# Patient Record
Sex: Female | Born: 1965 | Race: White | Hispanic: No | Smoking: Current every day smoker
Health system: Southern US, Community
[De-identification: ages and names within clinical notes are randomized; demographics above are authoritative.]

## PROBLEM LIST (undated history)

## (undated) DIAGNOSIS — E119 Type 2 diabetes mellitus without complications: Secondary | ICD-10-CM

## (undated) DIAGNOSIS — D649 Anemia, unspecified: Secondary | ICD-10-CM

## (undated) DIAGNOSIS — M1990 Unspecified osteoarthritis, unspecified site: Secondary | ICD-10-CM

## (undated) DIAGNOSIS — K219 Gastro-esophageal reflux disease without esophagitis: Secondary | ICD-10-CM

## (undated) DIAGNOSIS — I639 Cerebral infarction, unspecified: Secondary | ICD-10-CM

## (undated) DIAGNOSIS — L237 Allergic contact dermatitis due to plants, except food: Secondary | ICD-10-CM

## (undated) DIAGNOSIS — K760 Fatty (change of) liver, not elsewhere classified: Secondary | ICD-10-CM

## (undated) DIAGNOSIS — B977 Papillomavirus as the cause of diseases classified elsewhere: Secondary | ICD-10-CM

## (undated) DIAGNOSIS — I739 Peripheral vascular disease, unspecified: Secondary | ICD-10-CM

## (undated) DIAGNOSIS — Z87442 Personal history of urinary calculi: Secondary | ICD-10-CM

## (undated) DIAGNOSIS — Z8719 Personal history of other diseases of the digestive system: Secondary | ICD-10-CM

## (undated) DIAGNOSIS — I2510 Atherosclerotic heart disease of native coronary artery without angina pectoris: Secondary | ICD-10-CM

## (undated) DIAGNOSIS — T4145XA Adverse effect of unspecified anesthetic, initial encounter: Secondary | ICD-10-CM

## (undated) DIAGNOSIS — J189 Pneumonia, unspecified organism: Secondary | ICD-10-CM

## (undated) DIAGNOSIS — J449 Chronic obstructive pulmonary disease, unspecified: Secondary | ICD-10-CM

## (undated) DIAGNOSIS — I6529 Occlusion and stenosis of unspecified carotid artery: Secondary | ICD-10-CM

## (undated) HISTORY — PX: CAROTID ENDARTERECTOMY: SUR193

## (undated) HISTORY — DX: Occlusion and stenosis of unspecified carotid artery: I65.29

## (undated) HISTORY — PX: CYSTOCELE REPAIR: SHX163

## (undated) HISTORY — PX: ABDOMINAL HYSTERECTOMY: SHX81

## (undated) HISTORY — PX: DILATION AND CURETTAGE OF UTERUS: SHX78

## (undated) HISTORY — PX: AORTA SURGERY: SHX548

## (undated) HISTORY — PX: RECTOCELE REPAIR: SHX761

---

## 2011-05-15 DIAGNOSIS — T8859XA Other complications of anesthesia, initial encounter: Secondary | ICD-10-CM

## 2011-05-15 HISTORY — DX: Other complications of anesthesia, initial encounter: T88.59XA

## 2012-05-14 DIAGNOSIS — I639 Cerebral infarction, unspecified: Secondary | ICD-10-CM

## 2012-05-14 HISTORY — DX: Cerebral infarction, unspecified: I63.9

## 2015-07-21 DIAGNOSIS — N182 Chronic kidney disease, stage 2 (mild): Secondary | ICD-10-CM | POA: Insufficient documentation

## 2016-02-22 DIAGNOSIS — M79644 Pain in right finger(s): Secondary | ICD-10-CM | POA: Insufficient documentation

## 2016-02-22 DIAGNOSIS — E118 Type 2 diabetes mellitus with unspecified complications: Secondary | ICD-10-CM | POA: Insufficient documentation

## 2016-02-22 DIAGNOSIS — I6523 Occlusion and stenosis of bilateral carotid arteries: Secondary | ICD-10-CM | POA: Insufficient documentation

## 2016-12-27 DIAGNOSIS — Z720 Tobacco use: Secondary | ICD-10-CM | POA: Insufficient documentation

## 2016-12-27 DIAGNOSIS — Z8673 Personal history of transient ischemic attack (TIA), and cerebral infarction without residual deficits: Secondary | ICD-10-CM | POA: Insufficient documentation

## 2016-12-31 DIAGNOSIS — I2510 Atherosclerotic heart disease of native coronary artery without angina pectoris: Secondary | ICD-10-CM | POA: Insufficient documentation

## 2016-12-31 DIAGNOSIS — I10 Essential (primary) hypertension: Secondary | ICD-10-CM | POA: Insufficient documentation

## 2016-12-31 DIAGNOSIS — E782 Mixed hyperlipidemia: Secondary | ICD-10-CM | POA: Insufficient documentation

## 2017-05-31 ENCOUNTER — Other Ambulatory Visit: Payer: Self-pay

## 2017-05-31 DIAGNOSIS — I6522 Occlusion and stenosis of left carotid artery: Secondary | ICD-10-CM

## 2017-06-05 ENCOUNTER — Ambulatory Visit: Payer: Medicare PPO | Admitting: Vascular Surgery

## 2017-06-05 ENCOUNTER — Ambulatory Visit (HOSPITAL_COMMUNITY)
Admission: RE | Admit: 2017-06-05 | Discharge: 2017-06-05 | Disposition: A | Discharge status: Home / Self Care | Payer: Medicare PPO | Source: Ambulatory Visit | Attending: Vascular Surgery | Admitting: Vascular Surgery

## 2017-06-05 ENCOUNTER — Encounter: Payer: Self-pay | Admitting: Vascular Surgery

## 2017-06-05 VITALS — BP 127/78 | HR 86 | Temp 97.3°F | Resp 16 | Ht 61.0 in | Wt 148.0 lb

## 2017-06-05 DIAGNOSIS — I6522 Occlusion and stenosis of left carotid artery: Secondary | ICD-10-CM

## 2017-06-05 DIAGNOSIS — I6523 Occlusion and stenosis of bilateral carotid arteries: Secondary | ICD-10-CM | POA: Diagnosis not present

## 2017-06-05 NOTE — Progress Notes (Signed)
Patient name: Isabella [name] MRN: [id number] DOB: October 15, 1965 Sex: female   REASON FOR CONSULT:    Greater than 70% left carotid stenosis.  The consult is requested by Dr. Bonita Quin.  HPI:   Isabella [name] is a pleasant 52 y.o. female, with a very complicated surgical history.  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  On a duplex scan she was found to have a greater than 70% stenosis on the left side and is sent for vascular consultation.  She is right-handed.  She denies any sudden onset of weakness or paresthesias in the right upper extremity or lower extremity.  She denies expressive or receptive aphasia.  She denies amaurosis fugax.  She is on aspirin.  She does not tolerate statins.  She does report that she had a cardiac arrest after surgery in [place].  Her blood pressure also gets very high after anesthesia.  Her risk factors for peripheral vascular disease include diabetes, hypertension, hypercholesterolemia, and tobacco use.  She denies any family history of premature cardiovascular disease.  No past medical history on file.  The patient has diabetes, hypertension, hypercholesterolemia,.  Her diabetes is poorly controlled.  Her most recent hemoglobin A1c was 9.8 according to the patient.   No family history on file.  The patient denies any family history of premature cardiovascular disease.  She does however have a sister who had a factor V mutation.  In addition she has a sister who had [name] abdominal aortic aneurysm.  SOCIAL HISTORY: She smokes 1 pack/day  of cigarettes and has been smoking for 40 years. Social History   Socioeconomic History  . Marital status: Married    Spouse name: Not on file  . Number of children: Not on file  . Years of education: Not on file  . Highest education level: Not on file  Social Needs  . Financial resource strain: Not on file  . Food insecurity - worry: Not on file  . Food insecurity - inability: Not on file  . Transportation needs - medical: Not on file  . Transportation needs - non-medical: Not on file  Occupational History  . Not on file  Tobacco Use  . Smoking status: Current Every Day Smoker    Packs/day: 1.00    Types: Cigarettes  . Smokeless tobacco: Never Used  Substance and Sexual Activity  . Alcohol use: No    Frequency: Never  . Drug use: No  . Sexual activity: Not on file  Other Topics Concern  . Not on file  Social History Narrative  . Not on file    Allergies  Allergen Reactions  . Statins     Muscle weakness.    Current Outpatient Medications  Medication Sig Dispense Refill  . glipiZIDE (GLUCOTROL) 10 MG tablet Take 10 mg by mouth daily before breakfast.    . ASPIRIN 81 PO 81 tablets.     No current facility-administered medications for this visit.     REVIEW OF SYSTEMS:  [X]  denotes positive finding, [ ]  denotes negative finding Cardiac  Comments:  Chest pain or chest pressure:  Shortness of breath upon exertion:    Short of breath when lying flat:    Irregular heart rhythm:        Vascular    Pain in calf, thigh, or hip brought on by ambulation:    Pain in feet at night that wakes you up from your sleep:     Blood clot in your veins:    Leg swelling:  x       Pulmonary    Oxygen at home:    Productive cough:     Wheezing:         Neurologic    Sudden weakness in arms or legs:  x   Sudden numbness in arms or legs:     Sudden onset of difficulty speaking or slurred speech:    Temporary loss of vision in one eye:     Problems with dizziness:  x        Gastrointestinal    Blood in stool:     Vomited blood:         Genitourinary    Burning when urinating:     Blood in urine:        Psychiatric    Major depression:         Hematologic    Bleeding problems:    Problems with blood clotting too easily:        Skin    Rashes or ulcers:        Constitutional    Fever or chills:     PHYSICAL EXAM:   Vitals:   06/05/17 1120  BP: 127/78  Pulse: 86  Resp: 16  Temp: (!) 97.3 F (36.3 C)  TempSrc: Oral  SpO2: 94%  Weight: 148 lb (67.1 kg)  Height: 5\' 1"  (1.549 m)    GENERAL: The patient is a well-nourished female, in no acute distress. The vital signs are documented above. CARDIAC: There is a regular rate and rhythm.  VASCULAR: She has bilateral carotid bruits. She has palpable radial pulses bilaterally. She has palpable femoral, popliteal, dorsalis pedis, and posterior tibial pulses bilaterally. PULMONARY: There is good air exchange bilaterally without wheezing or rales. ABDOMEN: Soft and non-tender with normal pitched bowel sounds.  I do not palpate [name] abdominal aortic aneurysm. MUSCULOSKELETAL: There are no major deformities or cyanosis. NEUROLOGIC: No focal weakness or paresthesias are detected. SKIN: There are no ulcers or rashes noted. PSYCHIATRIC: The patient has a normal affect.  DATA:    CAROTID DUPLEX: I did review the carotid duplex scan that was done on 11/21/2016 in [place].  At that time, there was a less than 50% right carotid stenosis.  There was a greater than 70% left carotid stenosis.  CT HEAD: I reviewed the CT of the head that was done in October 2018.  This showed no evidence of stroke.  CAROTID DUPLEX: I have independently interpreted the carotid duplex scan that was done in our office today.  On the left side, there is a greater than 80% left carotid stenosis.  This extends into the mid internal carotid artery.  The bifurcation is fairly high on both sides.  On the right side there is a  proximal common carotid artery stenosis.  There is also evidence of a right vertebral artery stenosis on the right.  MEDICAL ISSUES:   GREATER THAN 80% ASYMPTOMATIC LEFT CAROTID STENOSIS: Given the severity of the stenosis of the left carotid artery, I would recommend left carotid endarterectomy in order to lower her risk  of future stroke.  However, based on the duplex, her bifurcation on the left is high and the plaque extends fairly high.  Therefore I think she needs a CT angiogram of the neck to assess the left carotid stenosis and determine if it is surgically accessible.  In addition her duplex scan shows that she has a stenosis in the proximal right common carotid artery which appears to be significant.  She also has a right vertebral artery stenosis.  She states that this test has been done in [place] and we are working to obtain these images for me to review.  If the stenosis on the left is surgically accessible then I would recommend left carotid endarterectomy.  With regards to the proximal common carotid artery stenosis on the right and the right vertebral stenosis this could be contributing to her dizziness which she has been having.  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  I will make further recommendations once were able to obtain her CT angiogram of the neck.  We have discussed the importance of tobacco cessation.  She is on aspirin.  She does not tolerate statins.  Waverly Ferrarihristopher Mckaylee Dimalanta [facility] [phone]

## 2017-06-05 NOTE — H&P (View-Only) (Signed)
  Patient name: Isabella [name] MRN: [id number] DOB: 06/07/1965 Sex: female   REASON FOR CONSULT:    Greater than 70% left carotid stenosis.  The consult is requested by Dr. Amanda Pallon.  HPI:   Isabella [name] is a pleasant 52 y.o. female, with a very complicated surgical history.  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  On a duplex scan she was found to have a greater than 70% stenosis on the left side and is sent for vascular consultation.  She is right-handed.  She denies any sudden onset of weakness or paresthesias in the right upper extremity or lower extremity.  She denies expressive or receptive aphasia.  She denies amaurosis fugax.  She is on aspirin.  She does not tolerate statins.  She does report that she had a cardiac arrest after surgery in [place].  Her blood pressure also gets very high after anesthesia.  Her risk factors for peripheral vascular disease include diabetes, hypertension, hypercholesterolemia, and tobacco use.  She denies any family history of premature cardiovascular disease.  No past medical history on file.  The patient has diabetes, hypertension, hypercholesterolemia,.  Her diabetes is poorly controlled.  Her most recent hemoglobin A1c was 9.8 according to the patient.   No family history on file.  The patient denies any family history of premature cardiovascular disease.  She does however have a sister who had a factor V mutation.  In addition she has a sister who had an abdominal aortic aneurysm.  SOCIAL HISTORY: She smokes 1 pack/day  of cigarettes and has been smoking for 40 years. Social History   Socioeconomic History  . Marital status: Married    Spouse name: Not on file  . Number of children: Not on file  . Years of education: Not on file  . Highest education level: Not on file  Social Needs  . Financial resource strain: Not on file  . Food insecurity - worry: Not on file  . Food insecurity - inability: Not on file  . Transportation needs - medical: Not on file  . Transportation needs - non-medical: Not on file  Occupational History  . Not on file  Tobacco Use  . Smoking status: Current Every Day Smoker    Packs/day: 1.00    Types: Cigarettes  . Smokeless tobacco: Never Used  Substance and Sexual Activity  . Alcohol use: No    Frequency: Never  . Drug use: No  . Sexual activity: Not on file  Other Topics Concern  . Not on file  Social History Narrative  . Not on file    Allergies  Allergen Reactions  . Statins     Muscle weakness.    Current Outpatient Medications  Medication Sig Dispense Refill  . glipiZIDE (GLUCOTROL) 10 MG tablet Take 10 mg by mouth daily before breakfast.    . ASPIRIN 81 PO 81 tablets.     No current facility-administered medications for this visit.     REVIEW OF SYSTEMS:  [X] denotes positive finding, [ ] denotes negative finding Cardiac  Comments:  Chest pain or chest pressure:      Shortness of breath upon exertion:    Short of breath when lying flat:    Irregular heart rhythm:        Vascular    Pain in calf, thigh, or hip brought on by ambulation:    Pain in feet at night that wakes you up from your sleep:     Blood clot in your veins:    Leg swelling:  x       Pulmonary    Oxygen at home:    Productive cough:     Wheezing:         Neurologic    Sudden weakness in arms or legs:  x   Sudden numbness in arms or legs:     Sudden onset of difficulty speaking or slurred speech:    Temporary loss of vision in one eye:     Problems with dizziness:  x        Gastrointestinal    Blood in stool:     Vomited blood:         Genitourinary    Burning when urinating:     Blood in urine:        Psychiatric    Major depression:         Hematologic    Bleeding problems:    Problems with blood clotting too easily:        Skin    Rashes or ulcers:        Constitutional    Fever or chills:     PHYSICAL EXAM:   Vitals:   06/05/17 1120  BP: 127/78  Pulse: 86  Resp: 16  Temp: (!) 97.3 F (36.3 C)  TempSrc: Oral  SpO2: 94%  Weight: 148 lb (67.1 kg)  Height: 5' 1" (1.549 m)    GENERAL: The patient is a well-nourished female, in no acute distress. The vital signs are documented above. CARDIAC: There is a regular rate and rhythm.  VASCULAR: She has bilateral carotid bruits. She has palpable radial pulses bilaterally. She has palpable femoral, popliteal, dorsalis pedis, and posterior tibial pulses bilaterally. PULMONARY: There is good air exchange bilaterally without wheezing or rales. ABDOMEN: Soft and non-tender with normal pitched bowel sounds.  I do not palpate an abdominal aortic aneurysm. MUSCULOSKELETAL: There are no major deformities or cyanosis. NEUROLOGIC: No focal weakness or paresthesias are detected. SKIN: There are no ulcers or rashes noted. PSYCHIATRIC: The patient has a normal affect.  DATA:    CAROTID DUPLEX: I did review the carotid duplex scan that was done on 11/21/2016 in [place].  At that time, there was a less than 50% right carotid stenosis.  There was a greater than 70% left carotid stenosis.  CT HEAD: I reviewed the CT of the head that was done in October 2018.  This showed no evidence of stroke.  CAROTID DUPLEX: I have independently interpreted the carotid duplex scan that was done in our office today.  On the left side, there is a greater than 80% left carotid stenosis.  This extends into the mid internal carotid artery.  The bifurcation is fairly high on both sides.  On the right side there is a  proximal common carotid artery stenosis.  There is also evidence of a right vertebral artery stenosis on the right.  MEDICAL ISSUES:   GREATER THAN 80% ASYMPTOMATIC LEFT CAROTID STENOSIS: Given the severity of the stenosis of the left carotid artery, I would recommend left carotid endarterectomy in order to lower her risk   of future stroke.  However, based on the duplex, her bifurcation on the left is high and the plaque extends fairly high.  Therefore I think she needs a CT angiogram of the neck to assess the left carotid stenosis and determine if it is surgically accessible.  In addition her duplex scan shows that she has a stenosis in the proximal right common carotid artery which appears to be significant.  She also has a right vertebral artery stenosis.  She states that this test has been done in [place] and we are working to obtain these images for me to review.  If the stenosis on the left is surgically accessible then I would recommend left carotid endarterectomy.  With regards to the proximal common carotid artery stenosis on the right and the right vertebral stenosis this could be contributing to her dizziness which she has been having.  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  In addition she has had an innominate artery bypass on the right.  I will make further recommendations once were able to obtain her CT angiogram of the neck.  We have discussed the importance of tobacco cessation.  She is on aspirin.  She does not tolerate statins.  Christopher Dickson [facility] [place] [facility] [phone] 

## 2017-06-13 ENCOUNTER — Other Ambulatory Visit: Payer: Self-pay | Admitting: *Deleted

## 2017-06-19 NOTE — Pre-Procedure Instructions (Signed)
Purcell MoutonLisa Rae Yeoman  06/19/2017      [id number] - [place], [place] - [address] [place] [id number] Phone: [phone] Fax: [phone]    Your procedure is scheduled on 06/25/2017.  [facility].M.  Call this number if you have problems the morning of surgery:  [phone]   Remember:  Do not eat food or drink liquids after midnight.   Continue all medications as directed by your physician except follow these medication instructions before surgery below   Take these medicines the morning of surgery with A SIP OF WATER: NONE  7 days prior to surgery STOP taking any Aleve, Naproxen, Ibuprofen, Motrin, Advil, Goody's, BC's, all herbal medications, fish oil, and all vitamins.  Follow your doctors instructions regarding your Aspirin.  If no instructions were given by your doctor, then you will need to call the prescribing office office to get instructions.    WHAT DO I DO ABOUT MY DIABETES MEDICATION?  Marland Kitchen. Do not take oral diabetes medicines (pills) the morning of surgery.    How to Manage Your Diabetes Before and After Surgery  Why is it important to control my blood sugar before and after surgery? . Improving blood sugar levels before and after surgery helps healing and can limit problems. . A way of improving blood sugar control is eating a healthy diet by: o  Eating less sugar and carbohydrates o  Increasing activity/exercise o  Talking with your doctor about reaching your blood sugar goals . High blood sugars (greater than 180 mg/dL) can raise your risk of infections and slow your recovery, so you will need to focus on controlling your diabetes during the weeks before surgery. . Make sure that the doctor who takes care of your diabetes knows about your planned surgery including the date and location.  How do I manage my blood sugar before surgery? . Check your blood sugar at least 4 times  a day, starting 2 days before surgery, to make sure that the level is not too high or low. o Check your blood sugar the morning of your surgery when you wake up and every 2 hours until you get to the Short Stay unit. . If your blood sugar is less than 70 mg/dL, you will need to treat for low blood sugar: o Do not take insulin. o Treat a low blood sugar (less than 70 mg/dL) with  cup of clear juice (cranberry or apple), 4 glucose tablets, OR glucose gel. Recheck blood sugar in 15 minutes after treatment (to make sure it is greater than 70 mg/dL). If your blood sugar is not greater than 70 mg/dL on recheck, call [phone] o  for further instructions. . Report your blood sugar to the short stay nurse when you get to Short Stay.  . If you are admitted to the hospital after surgery: o Your blood sugar will be checked by the staff and you will probably be given insulin after surgery (instead of oral diabetes medicines) to make sure you have good blood sugar levels. o The goal for blood sugar control after surgery is 80-180 mg/dL.     Do not wear jewelry, make-up or nail polish.  Do not wear lotions, powders, or perfumes, or deodorant.  Do not shave 48 hours prior to surgery.   Do not bring valuables to the hospital.  [facility] is not responsible for any belongings or valuables.  Hearing aids, eyeglasses,  contacts, dentures or bridgework may not be worn into surgery.  Leave your suitcase in the car.  After surgery it may be brought to your room.  For patients admitted to the hospital, discharge time will be determined by your treatment team.  Patients discharged the day of surgery will not be allowed to drive home.   Name and phone number of your driver:    Special instructions:   [place]- Preparing For Surgery  Before surgery, you can play an important role. Because skin is not sterile, your skin needs to be as free of germs as possible. You can reduce the number of germs on your  skin by washing with CHG (chlorahexidine gluconate) Soap before surgery.  CHG is an antiseptic cleaner which kills germs and bonds with the skin to continue killing germs even after washing.  Please do not use if you have an allergy to CHG or antibacterial soaps. If your skin becomes reddened/irritated stop using the CHG.  Do not shave (including legs and underarms) for at least 48 hours prior to first CHG shower. It is OK to shave your face.  Please follow these instructions carefully.   1. Shower the NIGHT BEFORE SURGERY and the MORNING OF SURGERY with CHG.   2. If you chose to wash your hair, wash your hair first as usual with your normal shampoo.  3. After you shampoo, rinse your hair and body thoroughly to remove the shampoo.  4. Use CHG as you would any other liquid soap. You can apply CHG directly to the skin and wash gently with a scrungie or a clean washcloth.   5. Apply the CHG Soap to your body ONLY FROM THE NECK DOWN.  Do not use on open wounds or open sores. Avoid contact with your eyes, ears, mouth and genitals (private parts). Wash Face and genitals (private parts)  with your normal soap.  6. Wash thoroughly, paying special attention to the area where your surgery will be performed.  7. Thoroughly rinse your body with warm water from the neck down.  8. DO NOT shower/wash with your normal soap after using and rinsing off the CHG Soap.  9. Pat yourself dry with a CLEAN TOWEL.  10. Wear CLEAN PAJAMAS to bed the night before surgery, wear comfortable clothes the morning of surgery  11. Place CLEAN SHEETS on your bed the night of your first shower and DO NOT SLEEP WITH PETS.    Day of Surgery: Shower as stated above. Do not apply any deodorants/lotions. Please wear clean clothes to the hospital/surgery center.      Please read over the following fact sheets that you were given.

## 2017-06-20 ENCOUNTER — Telehealth: Payer: Self-pay | Admitting: Vascular Surgery

## 2017-06-20 ENCOUNTER — Encounter (HOSPITAL_COMMUNITY): Payer: Self-pay

## 2017-06-20 ENCOUNTER — Encounter (HOSPITAL_COMMUNITY)
Admission: RE | Admit: 2017-06-20 | Discharge: 2017-06-20 | Disposition: A | Discharge status: Home / Self Care | Payer: Medicare PPO | Source: Ambulatory Visit | Attending: Vascular Surgery | Admitting: Vascular Surgery

## 2017-06-20 ENCOUNTER — Other Ambulatory Visit: Payer: Self-pay

## 2017-06-20 ENCOUNTER — Other Ambulatory Visit: Payer: Self-pay | Admitting: *Deleted

## 2017-06-20 DIAGNOSIS — I771 Stricture of artery: Secondary | ICD-10-CM | POA: Diagnosis present

## 2017-06-20 DIAGNOSIS — I6523 Occlusion and stenosis of bilateral carotid arteries: Secondary | ICD-10-CM | POA: Diagnosis not present

## 2017-06-20 HISTORY — DX: Cerebral infarction, unspecified: I63.9

## 2017-06-20 HISTORY — DX: Adverse effect of unspecified anesthetic, initial encounter: T41.45XA

## 2017-06-20 HISTORY — DX: Gastro-esophageal reflux disease without esophagitis: K21.9

## 2017-06-20 HISTORY — DX: Pneumonia, unspecified organism: J18.9

## 2017-06-20 HISTORY — DX: Chronic obstructive pulmonary disease, unspecified: J44.9

## 2017-06-20 HISTORY — DX: Type 2 diabetes mellitus without complications: E11.9

## 2017-06-20 HISTORY — DX: Peripheral vascular disease, unspecified: I73.9

## 2017-06-20 HISTORY — DX: Personal history of other diseases of the digestive system: Z87.19

## 2017-06-20 HISTORY — DX: Unspecified osteoarthritis, unspecified site: M19.90

## 2017-06-20 LAB — URINALYSIS, ROUTINE W REFLEX MICROSCOPIC
BACTERIA UA: NONE SEEN
Bilirubin Urine: NEGATIVE
Glucose, UA: >=500 mg/dL — AB
HGB URINE DIPSTICK: NEGATIVE
Ketones, ur: 5 mg/dL — AB
Leukocytes, UA: NEGATIVE
Nitrite: NEGATIVE
Protein, ur: NEGATIVE mg/dL
SPECIFIC GRAVITY, URINE: 1.016 (ref 1.005–1.030)
pH: 6 (ref 5.0–8.0)

## 2017-06-20 LAB — ABO/RH: ABO/RH(D): O POS

## 2017-06-20 LAB — COMPREHENSIVE METABOLIC PANEL
ALBUMIN: 3.9 g/dL (ref 3.5–5.0)
ALK PHOS: 75 U/L (ref 38–126)
ALT: 34 U/L (ref 14–54)
ANION GAP: 12 (ref 5–15)
AST: 25 U/L (ref 15–41)
BILIRUBIN TOTAL: 0.6 mg/dL (ref 0.3–1.2)
BUN: 15 mg/dL (ref 6–20)
CALCIUM: 9.1 mg/dL (ref 8.9–10.3)
CO2: 21 mmol/L — ABNORMAL LOW (ref 22–32)
Chloride: 102 mmol/L (ref 101–111)
Creatinine, Ser: 0.91 mg/dL (ref 0.44–1.00)
Glucose, Bld: 193 mg/dL — ABNORMAL HIGH (ref 65–99)
POTASSIUM: 4.2 mmol/L (ref 3.5–5.1)
Sodium: 135 mmol/L (ref 135–145)
TOTAL PROTEIN: 6.9 g/dL (ref 6.5–8.1)

## 2017-06-20 LAB — CBC
HEMATOCRIT: 44.4 % (ref 36.0–46.0)
HEMOGLOBIN: 15.4 g/dL — AB (ref 12.0–15.0)
MCH: 31.4 pg (ref 26.0–34.0)
MCHC: 34.7 g/dL (ref 30.0–36.0)
MCV: 90.4 fL (ref 78.0–100.0)
Platelets: 293 10*3/uL (ref 150–400)
RBC: 4.91 MIL/uL (ref 3.87–5.11)
RDW: 13.3 % (ref 11.5–15.5)
WBC: 12.2 10*3/uL — ABNORMAL HIGH (ref 4.0–10.5)

## 2017-06-20 LAB — TYPE AND SCREEN
ABO/RH(D): O POS
ANTIBODY SCREEN: NEGATIVE

## 2017-06-20 LAB — PROTIME-INR
INR: 0.91
PROTHROMBIN TIME: 12.1 s (ref 11.4–15.2)

## 2017-06-20 LAB — APTT: APTT: 26 s (ref 24–36)

## 2017-06-20 LAB — SURGICAL PCR SCREEN
MRSA, PCR: NEGATIVE
STAPHYLOCOCCUS AUREUS: NEGATIVE

## 2017-06-20 LAB — GLUCOSE, CAPILLARY: GLUCOSE-CAPILLARY: 225 mg/dL — AB (ref 65–99)

## 2017-06-20 NOTE — Progress Notes (Addendum)
Anesthesia PAT Evaluation: Patient is a 52 year old female scheduled for left carotid endarterectomy on 06/25/17 by Dr. Deitra Mayo.  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 Patience Musca, [facility]) complicated by what sounds like aspiration with respiratory code and emergency intubation. According to [facility] notes from 2013, "Protein C and Factor V are negative.  Only slight elevation of ESR, ANA and rheumatoid factor negative."  - PCP is Dr. Brooke Bonito with [facility]. - Cardiologist is Dr. Luiz Ochoa with [facility] in [place].  Meds include ASA 81 mg (occasionally), glipizide.  BP (!) 146/59   Pulse 100   Temp 36.4 C   Resp 18   Ht '5\' 1"'  (1.549 m)   Wt 152 lb (68.9 kg)   SpO2 98%   BMI 28.72 kg/m  Patient is a pleasant, talkative, Caucasian female. Heart RRR, no murmur note. Lungs clear. Simla II. She reports three bridges and upper central incisors have crowns. She denied chest pain and SOB at rest. She does have occasional DOE, but is able to hike. She gets occasional dizziness.     EKG 01/28/17: Requested for Dr. Renold Genta office.   Stress test: Requested.  CTA neck 06/21/17: PENDING.  Carotid U/S 06/05/17: Final Interpretation: Right Carotid: Velocities in the proximal right ICA are consistent with a 1-39%        stenosis. Hemodynamically significant stenosis >50% visualized in        the CCA. Left Carotid: Velocities in the proximal and mid left ICA are consistent with a       80-99% stenosis. The bifurcation is relatively high wtih stenosis       extending into the mid section. Vertebrals:  Both vertebral arteries were patent with antegrade flow. Subclavians: Normal flow hemodynamics were seen in bilateral subclavian       arteries.  Preoperative labs noted. WBC 12.2, H/H 15.4/44.4. PLT 293K. PT/PTT WNL. UA negative for leukocytes and nitrites.  Glucose 193. Cr 0.91. A1c 9.1 on 05/20/17. She does not check CBGs at home. A1c result forwarded to Dr. Scot Dock.  Anesthesia records from 2013 requested. It sounds like she had post-operative respiratory arrest following aortic-innominate artery bypass, but has had multiple other surgeries without known complication. Also awaiting cardiology records.   George Hugh [facility] Short Stay Center/Anesthesiology Phone [phone] 06/20/2017 5:46 PM  Addendum: Cardiology records received (~ 4:45 PM) . Last office visit 12/31/16 with Dr. Luiz Ochoa for pre-operative visit prior to left CEA. (She reportedly saw at least 1 or 2 other vascular surgeons before deciding to go with Dr. Scot Dock.) He ordered a stress and echo.  ETT 01/11/17: Interpretation: Summary: Resting ECG: SR, RAE. Functional capacity: Normal. HR response to exercise appropriate. BP Response to exercise: resting hypertension - exaggerated response. Chest pain: None. Arrhythmias: ventricular premature beats - pairs, isolated; ventricular premature beats - isolated. ST Changes: Depression upsloping. Probable normal stress test. S-T changes suggestive of hypertension. Good exercise tolerance without symptoms. Overall Impression: Normal Stress Test.  Echo 01/09/17: Conclusions: 1.  Mild to moderate concentric LVH with normal cavity size and systolic function.   2.  Normal right ventricular dimensions and function. 3.  No major valvular abnormalities noted. (Trace to mild mitral regurgitation.  Trace tricuspid regurgitation.  Trace pulmonic regurgitation.) 4.  Diastolic dysfunction.  EKG 12/31/16: Sinus rhythm. Right atrial enlargement.  Possible left atrial enlargement.   Possible right ventricular conduction delay (RSR prime or QR in V1/V2).  Nonspecific T wave abnormality.  CTA head/neck 06/21/17: IMPRESSION: 1. 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 2. Severe, near occlusive stenosis of the proximal left internal carotid artery, with luminal diameter of 1 mm and stenosis measuring 90% by NASCET criteria. 3. 55% stenosis of the proximal right internal carotid artery by NASCET criteria. 4. Moderate-to-severe narrowing of the cavernous and ophthalmic segments of both internal carotid arteries at the skull base. 5. No intracranial arterial occlusion or high-grade stenosis.  I had our [place] request records from [facility], but anesthesia records and discharge summary have still not been received. As above, it sounded like she developed respiratory failure post aortic arch-innominate bypass in 2013, but had not had prior issues with anesthesia. Overall, it appeared that her cardiac testing last year was reassuring. Anesthesiologist to evaluate on the day of surgery.    George Hugh [facility] Short Stay Center/Anesthesiology Phone [phone] 06/24/2017 5:02 PM

## 2017-06-20 NOTE — Progress Notes (Signed)
PCP - Dr. Reynaldo MiniumPallone Cardiologist - Saw Dr. Catha [facility]osselinBanerjee at [facility] [place] cardiac clearance in 01/2017, requesting records from their office.  Chest x-ray - n/a EKG - 01/28/2017, requested tracing Stress Test - 01/28/2017, requested results ECHO - patient denies Cardiac Cath - patient denies  Sleep Study - patient denies   Fasting Blood Sugar - patient unsure, she does not check her blood sugar at home Checks Blood Sugar 0 times a day Patient's last A1C was 9.1 on 05/20/2017 Patient educated on importance of having her blood sugar under control (not too high or too low) for surgery.  Patient educated on the importance of watching her diet and carbohydrate intake as well.  Aspirin Instructions: instructed patient to contact Dr. Adele Danickson's office regarding instructions  Anesthesia review: yes, patient stated she "couldn't wake up from anesthesia and coded" during aorta bypass and stent surgery that [place] had in [place] 5 years ago.  Contacted Revonda Standardllison to see patient during her PAT appointment.  Patient also has an elevated A1C.  Patient denies shortness of breath, fever, cough and chest pain at PAT appointment   Patient verbalized understanding of instructions that were given to them at the PAT appointment. Patient was also instructed that they will need to review over the PAT instructions again at home before surgery.

## 2017-06-20 NOTE — Telephone Encounter (Signed)
[facility], the only opening before their sched surgery 06/25/17 is [facility]. Lm on pt's # to inform them of appt and CTA instructions. Sent precert staff message.

## 2017-06-21 ENCOUNTER — Ambulatory Visit (HOSPITAL_COMMUNITY)
Admission: RE | Admit: 2017-06-21 | Discharge: 2017-06-21 | Disposition: A | Discharge status: Home / Self Care | Payer: Medicare PPO | Source: Ambulatory Visit | Attending: Vascular Surgery | Admitting: Vascular Surgery

## 2017-06-21 DIAGNOSIS — I771 Stricture of artery: Secondary | ICD-10-CM | POA: Diagnosis not present

## 2017-06-21 DIAGNOSIS — I6523 Occlusion and stenosis of bilateral carotid arteries: Secondary | ICD-10-CM | POA: Insufficient documentation

## 2017-06-21 MED ORDER — SODIUM CHLORIDE 0.9 % IJ SOLN
INTRAMUSCULAR | Status: AC
  Filled 2017-06-21: qty 50

## 2017-06-21 MED ORDER — IOPAMIDOL (ISOVUE-370) INJECTION 76%
INTRAVENOUS | Status: AC
  Filled 2017-06-21: qty 100

## 2017-06-21 MED ORDER — IOPAMIDOL (ISOVUE-370) INJECTION 76%
100.0000 mL | Freq: Once | INTRAVENOUS | Status: AC | PRN
  Administered 2017-06-21: 100 mL via INTRAVENOUS

## 2017-06-24 ENCOUNTER — Encounter: Payer: Self-pay | Admitting: Surgery

## 2017-06-25 ENCOUNTER — Inpatient Hospital Stay (HOSPITAL_COMMUNITY)
Admission: RE | Admit: 2017-06-25 | Discharge: 2017-06-26 | DRG: 039 | Disposition: A | Discharge status: Home / Self Care | Payer: Medicare PPO | Source: Ambulatory Visit | Attending: Vascular Surgery | Admitting: Vascular Surgery

## 2017-06-25 ENCOUNTER — Inpatient Hospital Stay (HOSPITAL_COMMUNITY): Payer: Medicare PPO | Admitting: Certified Registered Nurse Anesthetist

## 2017-06-25 ENCOUNTER — Encounter (HOSPITAL_COMMUNITY): Payer: Self-pay | Admitting: *Deleted

## 2017-06-25 ENCOUNTER — Inpatient Hospital Stay (HOSPITAL_COMMUNITY): Payer: Medicare PPO | Admitting: Vascular Surgery

## 2017-06-25 ENCOUNTER — Encounter (HOSPITAL_COMMUNITY)
Admission: RE | Disposition: A | Discharge status: Home / Self Care | Payer: Self-pay | Source: Ambulatory Visit | Attending: Vascular Surgery

## 2017-06-25 DIAGNOSIS — M170 Bilateral primary osteoarthritis of knee: Secondary | ICD-10-CM | POA: Diagnosis present

## 2017-06-25 DIAGNOSIS — Z7982 Long term (current) use of aspirin: Secondary | ICD-10-CM | POA: Diagnosis not present

## 2017-06-25 DIAGNOSIS — M19022 Primary osteoarthritis, left elbow: Secondary | ICD-10-CM | POA: Diagnosis present

## 2017-06-25 DIAGNOSIS — F17210 Nicotine dependence, cigarettes, uncomplicated: Secondary | ICD-10-CM | POA: Diagnosis present

## 2017-06-25 DIAGNOSIS — Z8673 Personal history of transient ischemic attack (TIA), and cerebral infarction without residual deficits: Secondary | ICD-10-CM

## 2017-06-25 DIAGNOSIS — M19021 Primary osteoarthritis, right elbow: Secondary | ICD-10-CM | POA: Diagnosis present

## 2017-06-25 DIAGNOSIS — I10 Essential (primary) hypertension: Secondary | ICD-10-CM | POA: Diagnosis present

## 2017-06-25 DIAGNOSIS — Z7984 Long term (current) use of oral hypoglycemic drugs: Secondary | ICD-10-CM | POA: Diagnosis not present

## 2017-06-25 DIAGNOSIS — K219 Gastro-esophageal reflux disease without esophagitis: Secondary | ICD-10-CM | POA: Diagnosis present

## 2017-06-25 DIAGNOSIS — Z9109 Other allergy status, other than to drugs and biological substances: Secondary | ICD-10-CM | POA: Diagnosis not present

## 2017-06-25 DIAGNOSIS — I6522 Occlusion and stenosis of left carotid artery: Secondary | ICD-10-CM | POA: Diagnosis not present

## 2017-06-25 DIAGNOSIS — I6523 Occlusion and stenosis of bilateral carotid arteries: Secondary | ICD-10-CM | POA: Diagnosis present

## 2017-06-25 DIAGNOSIS — E7800 Pure hypercholesterolemia, unspecified: Secondary | ICD-10-CM | POA: Diagnosis present

## 2017-06-25 DIAGNOSIS — E1151 Type 2 diabetes mellitus with diabetic peripheral angiopathy without gangrene: Secondary | ICD-10-CM | POA: Diagnosis present

## 2017-06-25 DIAGNOSIS — J449 Chronic obstructive pulmonary disease, unspecified: Secondary | ICD-10-CM | POA: Diagnosis present

## 2017-06-25 DIAGNOSIS — I6529 Occlusion and stenosis of unspecified carotid artery: Secondary | ICD-10-CM | POA: Diagnosis present

## 2017-06-25 HISTORY — DX: Allergic contact dermatitis due to plants, except food: L23.7

## 2017-06-25 HISTORY — PX: PATCH ANGIOPLASTY: SHX6230

## 2017-06-25 HISTORY — PX: ENDARTERECTOMY: SHX5162

## 2017-06-25 LAB — CBC
HCT: 41.5 % (ref 36.0–46.0)
Hemoglobin: 13.7 g/dL (ref 12.0–15.0)
MCH: 30 pg (ref 26.0–34.0)
MCHC: 33 g/dL (ref 30.0–36.0)
MCV: 91 fL (ref 78.0–100.0)
PLATELETS: 228 10*3/uL (ref 150–400)
RBC: 4.56 MIL/uL (ref 3.87–5.11)
RDW: 13.3 % (ref 11.5–15.5)
WBC: 19.4 10*3/uL — ABNORMAL HIGH (ref 4.0–10.5)

## 2017-06-25 LAB — GLUCOSE, CAPILLARY
GLUCOSE-CAPILLARY: 223 mg/dL — AB (ref 65–99)
Glucose-Capillary: 233 mg/dL — ABNORMAL HIGH (ref 65–99)
Glucose-Capillary: 221 mg/dL — ABNORMAL HIGH (ref 65–99)
Glucose-Capillary: 181 mg/dL — ABNORMAL HIGH (ref 65–99)

## 2017-06-25 LAB — CREATININE, SERUM
Creatinine, Ser: 0.66 mg/dL (ref 0.44–1.00)
GFR calc Af Amer: >60 mL/min (ref >60)
GFR calc non Af Amer: >60 mL/min (ref >60)

## 2017-06-25 LAB — HEMOGLOBIN A1C
HEMOGLOBIN A1C: 9.2 % — AB (ref 4.8–5.6)
Mean Plasma Glucose: 217.34 mg/dL

## 2017-06-25 SURGERY — ENDARTERECTOMY, CAROTID
Anesthesia: General | Site: Neck | Laterality: Left

## 2017-06-25 MED ORDER — DEXTRAN 40 IN SALINE 10-0.9 % IV SOLN
INTRAVENOUS | Status: AC | PRN
  Administered 2017-06-25: 500 mL

## 2017-06-25 MED ORDER — FENTANYL CITRATE (PF) 100 MCG/2ML IJ SOLN
25.0000 ug | INTRAMUSCULAR | Status: DC | PRN
  Administered 2017-06-25 (×2): 50 ug via INTRAVENOUS

## 2017-06-25 MED ORDER — GUAIFENESIN-DM 100-10 MG/5ML PO SYRP: 15.0000 mL | ORAL_SOLUTION | ORAL | Status: DC | PRN

## 2017-06-25 MED ORDER — METOPROLOL TARTRATE 5 MG/5ML IV SOLN: 2.0000 mg | INTRAVENOUS | Status: DC | PRN

## 2017-06-25 MED ORDER — SODIUM CHLORIDE 0.9 % IV SOLN
0.0125 ug/kg/min | INTRAVENOUS | Status: DC
  Filled 2017-06-25: qty 2000

## 2017-06-25 MED ORDER — SUGAMMADEX SODIUM 200 MG/2ML IV SOLN
INTRAVENOUS | Status: DC | PRN
  Administered 2017-06-25: 200 mg via INTRAVENOUS

## 2017-06-25 MED ORDER — LIDOCAINE HCL (PF) 1 % IJ SOLN
INTRAMUSCULAR | Status: AC
  Filled 2017-06-25: qty 30

## 2017-06-25 MED ORDER — HEPARIN SODIUM (PORCINE) 1000 UNIT/ML IJ SOLN
INTRAMUSCULAR | Status: DC | PRN
  Administered 2017-06-25: 7000 [IU] via INTRAVENOUS
  Administered 2017-06-25: 2000 [IU] via INTRAVENOUS

## 2017-06-25 MED ORDER — PHENOL 1.4 % MT LIQD: 1.0000 | OROMUCOSAL | Status: DC | PRN

## 2017-06-25 MED ORDER — MAGNESIUM SULFATE 2 GM/50ML IV SOLN: 2.0000 g | Freq: Every day | INTRAVENOUS | Status: DC | PRN

## 2017-06-25 MED ORDER — SODIUM CHLORIDE 0.9 % IV SOLN
INTRAVENOUS | Status: DC | PRN
  Administered 2017-06-25: .1 ug/kg/min via INTRAVENOUS

## 2017-06-25 MED ORDER — ASPIRIN 81 MG PO CHEW
81.0000 mg | CHEWABLE_TABLET | Freq: Every day | ORAL | Status: DC
  Administered 2017-06-25 – 2017-06-26 (×2): 81 mg via ORAL
  Filled 2017-06-25 (×2): qty 1

## 2017-06-25 MED ORDER — ONDANSETRON HCL 4 MG/2ML IJ SOLN
INTRAMUSCULAR | Status: AC
  Filled 2017-06-25: qty 2

## 2017-06-25 MED ORDER — SODIUM CHLORIDE 0.9 % IV SOLN: INTRAVENOUS | Status: DC

## 2017-06-25 MED ORDER — LIDOCAINE 2% (20 MG/ML) 5 ML SYRINGE
INTRAMUSCULAR | Status: AC
  Filled 2017-06-25: qty 5

## 2017-06-25 MED ORDER — CHLORHEXIDINE GLUCONATE CLOTH 2 % EX PADS: 6.0000 | MEDICATED_PAD | Freq: Once | CUTANEOUS | Status: DC

## 2017-06-25 MED ORDER — INSULIN ASPART 100 UNIT/ML ~~LOC~~ SOLN
0.0000 [IU] | Freq: Three times a day (TID) | SUBCUTANEOUS | Status: DC
  Administered 2017-06-25 – 2017-06-26 (×3): 3 [IU] via SUBCUTANEOUS

## 2017-06-25 MED ORDER — POTASSIUM CHLORIDE CRYS ER 20 MEQ PO TBCR: 20.0000 meq | EXTENDED_RELEASE_TABLET | Freq: Every day | ORAL | Status: DC | PRN

## 2017-06-25 MED ORDER — SODIUM CHLORIDE 0.9 % IV SOLN
INTRAVENOUS | Status: DC
  Administered 2017-06-25 (×2): via INTRAVENOUS

## 2017-06-25 MED ORDER — EPHEDRINE SULFATE 50 MG/ML IJ SOLN
INTRAMUSCULAR | Status: DC | PRN
  Administered 2017-06-25: 10 mg via INTRAVENOUS

## 2017-06-25 MED ORDER — HYDRALAZINE HCL 20 MG/ML IJ SOLN: 5.0000 mg | INTRAMUSCULAR | Status: DC | PRN

## 2017-06-25 MED ORDER — PROPOFOL 10 MG/ML IV BOLUS
INTRAVENOUS | Status: DC | PRN
  Administered 2017-06-25: 150 mg via INTRAVENOUS

## 2017-06-25 MED ORDER — SODIUM CHLORIDE 0.9 % IV SOLN
INTRAVENOUS | Status: DC | PRN
  Administered 2017-06-25: 500 mL

## 2017-06-25 MED ORDER — PROPOFOL 10 MG/ML IV BOLUS
INTRAVENOUS | Status: AC
  Filled 2017-06-25: qty 40

## 2017-06-25 MED ORDER — MIDAZOLAM HCL 2 MG/2ML IJ SOLN
INTRAMUSCULAR | Status: AC
  Filled 2017-06-25: qty 2

## 2017-06-25 MED ORDER — ACETAMINOPHEN 325 MG PO TABS: 325.0000 mg | ORAL_TABLET | ORAL | Status: DC | PRN

## 2017-06-25 MED ORDER — SODIUM CHLORIDE 0.9 % IV SOLN
1.5000 g | INTRAVENOUS | Status: AC
  Administered 2017-06-25: 1.5 g via INTRAVENOUS
  Filled 2017-06-25: qty 1.5

## 2017-06-25 MED ORDER — ENOXAPARIN SODIUM 40 MG/0.4ML ~~LOC~~ SOLN: 40.0000 mg | SUBCUTANEOUS | Status: DC

## 2017-06-25 MED ORDER — SODIUM CHLORIDE 0.9 % IV SOLN: 500.0000 mL | Freq: Once | INTRAVENOUS | Status: DC | PRN

## 2017-06-25 MED ORDER — LIDOCAINE 2% (20 MG/ML) 5 ML SYRINGE
INTRAMUSCULAR | Status: DC | PRN
  Administered 2017-06-25: 60 mg via INTRAVENOUS

## 2017-06-25 MED ORDER — MIDAZOLAM HCL 5 MG/5ML IJ SOLN
INTRAMUSCULAR | Status: DC | PRN
  Administered 2017-06-25 (×2): 1 mg via INTRAVENOUS

## 2017-06-25 MED ORDER — LIDOCAINE-EPINEPHRINE (PF) 1 %-1:200000 IJ SOLN
INTRAMUSCULAR | Status: AC
  Filled 2017-06-25: qty 30

## 2017-06-25 MED ORDER — DEXAMETHASONE SODIUM PHOSPHATE 10 MG/ML IJ SOLN
INTRAMUSCULAR | Status: AC
  Filled 2017-06-25: qty 1

## 2017-06-25 MED ORDER — FENTANYL CITRATE (PF) 100 MCG/2ML IJ SOLN
INTRAMUSCULAR | Status: DC | PRN
  Administered 2017-06-25 (×2): 50 ug via INTRAVENOUS

## 2017-06-25 MED ORDER — DEXTRAN 40 IN D5W 10 % IV SOLN
INTRAVENOUS | Status: AC
  Filled 2017-06-25: qty 500

## 2017-06-25 MED ORDER — OXYCODONE HCL 5 MG PO TABS
5.0000 mg | ORAL_TABLET | ORAL | Status: DC | PRN
  Administered 2017-06-26: 10 mg via ORAL
  Filled 2017-06-25: qty 2

## 2017-06-25 MED ORDER — PHENYLEPHRINE HCL 10 MG/ML IJ SOLN
INTRAVENOUS | Status: DC | PRN
  Administered 2017-06-25: 50 ug/min via INTRAVENOUS

## 2017-06-25 MED ORDER — LIDOCAINE-EPINEPHRINE (PF) 1 %-1:200000 IJ SOLN
INTRAMUSCULAR | Status: DC | PRN
  Administered 2017-06-25: 10 mL via INTRADERMAL

## 2017-06-25 MED ORDER — ONDANSETRON HCL 4 MG/2ML IJ SOLN: 4.0000 mg | Freq: Four times a day (QID) | INTRAMUSCULAR | Status: DC | PRN

## 2017-06-25 MED ORDER — DOCUSATE SODIUM 100 MG PO CAPS
100.0000 mg | ORAL_CAPSULE | Freq: Every day | ORAL | Status: DC
  Administered 2017-06-26: 100 mg via ORAL
  Filled 2017-06-25: qty 1

## 2017-06-25 MED ORDER — PROTAMINE SULFATE 10 MG/ML IV SOLN
INTRAVENOUS | Status: DC | PRN
  Administered 2017-06-25: 40 mg via INTRAVENOUS

## 2017-06-25 MED ORDER — LABETALOL HCL 5 MG/ML IV SOLN
INTRAVENOUS | Status: DC | PRN
  Administered 2017-06-25 (×3): 5 mg via INTRAVENOUS

## 2017-06-25 MED ORDER — CLOPIDOGREL BISULFATE 75 MG PO TABS
75.0000 mg | ORAL_TABLET | Freq: Every day | ORAL | Status: DC
Start: 2017-06-25 — End: 2017-06-26
  Administered 2017-06-25 – 2017-06-26 (×2): 75 mg via ORAL
  Filled 2017-06-25 (×2): qty 1

## 2017-06-25 MED ORDER — LABETALOL HCL 5 MG/ML IV SOLN
10.0000 mg | INTRAVENOUS | Status: DC | PRN
  Administered 2017-06-25: 10 mg via INTRAVENOUS
  Filled 2017-06-25: qty 4

## 2017-06-25 MED ORDER — SENNOSIDES-DOCUSATE SODIUM 8.6-50 MG PO TABS: 1.0000 | ORAL_TABLET | Freq: Every evening | ORAL | Status: DC | PRN

## 2017-06-25 MED ORDER — FENTANYL CITRATE (PF) 100 MCG/2ML IJ SOLN
INTRAMUSCULAR | Status: AC
  Filled 2017-06-25: qty 2

## 2017-06-25 MED ORDER — BISACODYL 5 MG PO TBEC
5.0000 mg | DELAYED_RELEASE_TABLET | Freq: Every day | ORAL | Status: DC | PRN
  Administered 2017-06-26: 5 mg via ORAL
  Filled 2017-06-25: qty 1

## 2017-06-25 MED ORDER — ALUM & MAG HYDROXIDE-SIMETH 200-200-20 MG/5ML PO SUSP: 15.0000 mL | ORAL | Status: DC | PRN

## 2017-06-25 MED ORDER — GLIPIZIDE 10 MG PO TABS
10.0000 mg | ORAL_TABLET | Freq: Two times a day (BID) | ORAL | Status: DC
  Administered 2017-06-25 – 2017-06-26 (×3): 10 mg via ORAL
  Filled 2017-06-25 (×3): qty 1

## 2017-06-25 MED ORDER — SODIUM CHLORIDE 0.9 % IV SOLN
1.5000 g | Freq: Two times a day (BID) | INTRAVENOUS | Status: AC
  Administered 2017-06-25 – 2017-06-26 (×2): 1.5 g via INTRAVENOUS
  Filled 2017-06-25 (×2): qty 1.5

## 2017-06-25 MED ORDER — ACETAMINOPHEN 325 MG RE SUPP
325.0000 mg | RECTAL | Status: DC | PRN
  Filled 2017-06-25: qty 2

## 2017-06-25 MED ORDER — ROCURONIUM BROMIDE 100 MG/10ML IV SOLN
INTRAVENOUS | Status: DC | PRN
  Administered 2017-06-25: 50 mg via INTRAVENOUS
  Administered 2017-06-25: 20 mg via INTRAVENOUS

## 2017-06-25 MED ORDER — 0.9 % SODIUM CHLORIDE (POUR BTL) OPTIME
TOPICAL | Status: DC | PRN
  Administered 2017-06-25: 1000 mL

## 2017-06-25 MED ORDER — FENTANYL CITRATE (PF) 250 MCG/5ML IJ SOLN
INTRAMUSCULAR | Status: AC
  Filled 2017-06-25: qty 5

## 2017-06-25 MED ORDER — PHENYLEPHRINE 40 MCG/ML (10ML) SYRINGE FOR IV PUSH (FOR BLOOD PRESSURE SUPPORT)
PREFILLED_SYRINGE | INTRAVENOUS | Status: DC | PRN
  Administered 2017-06-25 (×3): 80 ug via INTRAVENOUS

## 2017-06-25 MED ORDER — PANTOPRAZOLE SODIUM 40 MG PO TBEC
40.0000 mg | DELAYED_RELEASE_TABLET | Freq: Every day | ORAL | Status: DC
  Administered 2017-06-25 – 2017-06-26 (×2): 40 mg via ORAL
  Filled 2017-06-25 (×2): qty 1

## 2017-06-25 MED ORDER — LACTATED RINGERS IV SOLN
INTRAVENOUS | Status: DC | PRN
  Administered 2017-06-25: 07:00:00 via INTRAVENOUS

## 2017-06-25 MED ORDER — MORPHINE SULFATE (PF) 2 MG/ML IV SOLN
0.5000 mg | INTRAVENOUS | Status: DC | PRN
  Administered 2017-06-25 – 2017-06-26 (×4): 1 mg via INTRAVENOUS
  Filled 2017-06-25 (×4): qty 1

## 2017-06-25 SURGICAL SUPPLY — 52 items
BAG DECANTER FOR FLEXI CONT (MISCELLANEOUS) ×3 IMPLANT
CANISTER SUCT 3000ML PPV (MISCELLANEOUS) ×3 IMPLANT
CANNULA VESSEL 3MM 2 BLNT TIP (CANNULA) ×9 IMPLANT
CATH ROBINSON RED A/P 18FR (CATHETERS) ×3 IMPLANT
CATH SUCT 10FR WHISTLE TIP (CATHETERS) ×3 IMPLANT
CLIP VESOCCLUDE MED 24/CT (CLIP) ×3 IMPLANT
CLIP VESOCCLUDE SM WIDE 24/CT (CLIP) ×3 IMPLANT
CRADLE DONUT ADULT HEAD (MISCELLANEOUS) ×3 IMPLANT
DERMABOND ADHESIVE PROPEN (GAUZE/BANDAGES/DRESSINGS) ×2
DERMABOND ADVANCED (GAUZE/BANDAGES/DRESSINGS) ×2
DERMABOND ADVANCED .7 DNX12 (GAUZE/BANDAGES/DRESSINGS) ×1 IMPLANT
DERMABOND ADVANCED .7 DNX6 (GAUZE/BANDAGES/DRESSINGS) ×1 IMPLANT
DRAIN CHANNEL 15F RND FF W/TCR (WOUND CARE) IMPLANT
ELECT REM PT RETURN 9FT ADLT (ELECTROSURGICAL) ×3
ELECTRODE REM PT RTRN 9FT ADLT (ELECTROSURGICAL) ×1 IMPLANT
EVACUATOR SILICONE 100CC (DRAIN) IMPLANT
GEL ULTRASOUND 20GR AQUASONIC (MISCELLANEOUS) ×3 IMPLANT
GLOVE BIO SURGEON STRL SZ7.5 (GLOVE) ×3 IMPLANT
GLOVE BIOGEL PI IND STRL 6.5 (GLOVE) ×1 IMPLANT
GLOVE BIOGEL PI IND STRL 7.5 (GLOVE) ×1 IMPLANT
GLOVE BIOGEL PI IND STRL 8 (GLOVE) ×1 IMPLANT
GLOVE BIOGEL PI INDICATOR 6.5 (GLOVE) ×2
GLOVE BIOGEL PI INDICATOR 7.5 (GLOVE) ×2
GLOVE BIOGEL PI INDICATOR 8 (GLOVE) ×2
GLOVE ECLIPSE 6.5 STRL STRAW (GLOVE) ×3 IMPLANT
GLOVE ECLIPSE 7.0 STRL STRAW (GLOVE) ×3 IMPLANT
GOWN STRL REUS W/ TWL LRG LVL3 (GOWN DISPOSABLE) ×3 IMPLANT
GOWN STRL REUS W/TWL LRG LVL3 (GOWN DISPOSABLE) ×6
KIT BASIN OR (CUSTOM PROCEDURE TRAY) ×3 IMPLANT
KIT ROOM TURNOVER OR (KITS) ×3 IMPLANT
KIT SHUNT ARGYLE CAROTID ART 6 (VASCULAR PRODUCTS) IMPLANT
LOOP VESSEL MINI RED (MISCELLANEOUS) ×3 IMPLANT
NEEDLE HYPO 25GX1X1/2 BEV (NEEDLE) ×3 IMPLANT
NS IRRIG 1000ML POUR BTL (IV SOLUTION) ×6 IMPLANT
PACK CAROTID (CUSTOM PROCEDURE TRAY) ×3 IMPLANT
PAD ARMBOARD 7.5X6 YLW CONV (MISCELLANEOUS) ×6 IMPLANT
PATCH HEMASHIELD 8X150 (Vascular Products) ×3 IMPLANT
SHUNT CAROTID BYPASS 10 (VASCULAR PRODUCTS) IMPLANT
SHUNT CAROTID BYPASS 12 (VASCULAR PRODUCTS) IMPLANT
SLEEVE SURGEON STRL (DRAPES) ×3 IMPLANT
SPONGE SURGIFOAM ABS GEL 100 (HEMOSTASIS) IMPLANT
SUT PROLENE 6 0 BV (SUTURE) ×15 IMPLANT
SUT SILK 2 0 PERMA HAND 18 BK (SUTURE) IMPLANT
SUT SILK 3 0 (SUTURE) ×2
SUT SILK 3-0 18XBRD TIE 12 (SUTURE) ×1 IMPLANT
SUT VIC AB 3-0 SH 27 (SUTURE) ×2
SUT VIC AB 3-0 SH 27X BRD (SUTURE) ×1 IMPLANT
SUT VICRYL 4-0 PS2 18IN ABS (SUTURE) ×3 IMPLANT
SYR 20CC LL (SYRINGE) ×3 IMPLANT
SYR CONTROL 10ML LL (SYRINGE) ×3 IMPLANT
TOWEL GREEN STERILE (TOWEL DISPOSABLE) ×3 IMPLANT
WATER STERILE IRR 1000ML POUR (IV SOLUTION) ×3 IMPLANT

## 2017-06-25 NOTE — Op Note (Signed)
NAME: Isabella [name]    MRN: [id number] DOB: 1965-08-07    DATE OF OPERATION: 06/25/2017  PREOP DIAGNOSIS:    Symptomatic, greater than 80%, left carotid stenosis  POSTOP DIAGNOSIS:    Same  PROCEDURE:    Extensive left carotid endarterectomy with long Dacron patch angioplasty  SURGEON: Di Kindle. Edilia Bo, MD, FACS  ASSIST: Lianne Cure PA  ANESTHESIA: General  EBL: Minimal  INDICATIONS:    Isabella [name] is a 52 y.o. female who is undergone a previous right carotid endarterectomy in 2014 in [place].  In [place], she had an innominate artery angioplasty and ultimately required an innominate artery bypass.  She presented with a very tight left carotid stenosis that I was not sure would be surgically accessible.  CT scan did show that this was a high bifurcation and the plaque extended very high given the problems she had with stents according to the patient I felt that surgical endarterectomy was the best option.  She was also noted to have significant plaque in her common carotid artery.  FINDINGS:   Excellent internal carotid signal at the completion of the procedure with good diastolic flow.  The plaque did extend quite high and quite low.  TECHNIQUE:   The patient was taken to the operating room and received a general anesthetic.  Arterial line had been placed by anesthesia.  The left neck was prepped and draped in usual sterile fashion.  An incision was made along the anterior border of the sternocleidomastoid and the dissection carried down to the common carotid artery which was dissected free and controlled with a Rummel tourniquet.  The plaque extended very low and therefore had to extend the incision and dissected quite low on the common carotid artery to get below the plaque.  I then divided the facial vein between 2-0 silk ties.  The dissection was continued up the internal carotid artery after the patient had been heparinized.  The hypoglossal nerve was fully  mobilized to ask allow exposure of the distal internal carotid artery quite high.  The external carotid artery branched into multiple branches and these were all controlled separately.  Clamps were then placed on the internal then the external then the common carotid artery.  A longitudinal arteriotomy was made in the common carotid artery and this was extended through the plaque into the internal carotid artery.  The arteriotomy was extended proximally.  A 10 shunt was placed into the internal carotid artery, backbled and then placed into the common carotid artery and secured with Rummel tourniquet.  Flow was reestablished to the shunt and flow within the shunt checked with the Doppler.  An endarterectomy plane was established proximally and the plaque was sharply divided.  Eversion endarterectomy was performed of the external carotid artery.  Distally there was a nice tapering the plaque and no tacking sutures were required.  The artery was irrigated with copious amounts of heparin and dextran and all loose debris removed.  A long Dacron patch was then sewn using continuous 6-0 Prolene suture.  Prior to completing the patch closure, the shunt was removed.  The arteries were backbled and flushed appropriately and the anastomosis completed.  Flow was reestablished first to the external carotid artery and into the internal carotid artery.  At the completion there was a good Doppler signal in the internal carotid artery with good diastolic flow.  In the heparin was partially reversed with protamine.  The deep layer was closed with running 3-0 Vicryl.  The platysma was closed with running 3-0 Vicryl.  The skin was closed with 4-0 Vicryl.  Dermabond was applied.  Patient tolerated procedure well and awoke neurologically intact.  All needle and sponge counts were correct.  [name], MD, FACS [facility]  DATE OF DICTATION:   06/25/2017

## 2017-06-25 NOTE — Anesthesia Preprocedure Evaluation (Addendum)
Anesthesia Evaluation  Patient identified by MRN, date of birth, ID band Patient awake    Reviewed: Allergy & Precautions, NPO status , Patient's Chart, lab work & pertinent test results  Airway Mallampati: II  TM Distance: >3 FB     Dental   Pulmonary pneumonia, COPD, Current Smoker,    breath sounds clear to auscultation       Cardiovascular + Peripheral Vascular Disease   Rhythm:Regular Rate:Normal     Neuro/Psych    GI/Hepatic Neg liver ROS, hiatal hernia, GERD  ,  Endo/Other  diabetes  Renal/GU      Musculoskeletal   Abdominal   Peds  Hematology   Anesthesia Other Findings   Reproductive/Obstetrics                             Anesthesia Physical Anesthesia Plan  ASA: III  Anesthesia Plan: General   Post-op Pain Management:    Induction: Intravenous  PONV Risk Score and Plan: Treatment may vary due to age or medical condition, Ondansetron and Dexamethasone  Airway Management Planned:   Additional Equipment:   Intra-op Plan:   Post-operative Plan: Possible Post-op intubation/ventilation  Informed Consent: I have reviewed the patients History and Physical, chart, labs and discussed the procedure including the risks, benefits and alternatives for the proposed anesthesia with the patient or authorized representative who has indicated his/her understanding and acceptance.   Dental advisory given  Plan Discussed with: CRNA and Anesthesiologist  Anesthesia Plan Comments:         Anesthesia Quick Evaluation

## 2017-06-25 NOTE — Anesthesia Postprocedure Evaluation (Signed)
Anesthesia Post Note  Patient: Isabella [name]  Procedure(s) Performed: LEFT CAROTID ENDARTERECTOMY (Left Neck) PATCH ANGIOPLASTY OF LEFT CAROTID ARTERY USING HEMASHIELD PALTINUM FINESSE PATCH (Left Neck)     Patient location during evaluation: PACU Anesthesia Type: General Level of consciousness: awake Pain management: pain level controlled Vital Signs Assessment: post-procedure vital signs reviewed and stable Respiratory status: spontaneous breathing Cardiovascular status: stable Anesthetic complications: no    Last Vitals:  Vitals:   06/25/17 1030 06/25/17 1042  BP:  131/67  Pulse:  93  Resp:  18  Temp: (!) 36.3 C   SpO2:  98%    Last Pain:  Vitals:   06/25/17 1030  TempSrc:   PainSc: Asleep                 Cleotha Tsang

## 2017-06-25 NOTE — Anesthesia Procedure Notes (Signed)
Arterial Line Insertion Start/End2/04/2018 7:05 AM, 06/25/2017 7:10 AM Performed by: Caren Macadamarter, Mikyle Sox W, CRNA, CRNA  Patient location: Pre-op. Preanesthetic checklist: patient identified, IV checked, site marked, risks and benefits discussed, surgical consent, monitors and equipment checked, pre-op evaluation, timeout performed and anesthesia consent Lidocaine 1% used for infiltration Left, radial was placed Catheter size: 20 G Hand hygiene performed  and maximum sterile barriers used   Attempts: 1 Procedure performed without using ultrasound guided technique. Following insertion, dressing applied. Post procedure assessment: normal  Patient tolerated the procedure well with no immediate complications.

## 2017-06-25 NOTE — Interval H&P Note (Signed)
History and Physical Interval Note:  06/25/2017 7:27 AM  Isabella [name]  has presented today for surgery, with the diagnosis of left carotid stenosis  The various methods of treatment have been discussed with the patient and family. After consideration of risks, benefits and other options for treatment, the patient has consented to  Procedure(s): ENDARTERECTOMY CAROTID (Left) as a surgical intervention .  The patient's history has been reviewed, patient examined, no change in status, stable for surgery.  I have reviewed the patient's chart and labs.  Questions were answered to the patient's satisfaction.     Waverly Ferrarihristopher Demyah Smyre

## 2017-06-25 NOTE — Anesthesia Procedure Notes (Signed)
Procedure Name: Intubation Date/Time: 06/25/2017 7:43 AM Performed by: Montez Moritaarter, Tyde Lamison W, CRNA Pre-anesthesia Checklist: Patient identified, Emergency Drugs available, Suction available and Patient being monitored Patient Re-evaluated:Patient Re-evaluated prior to induction Oxygen Delivery Method: Circle system utilized Preoxygenation: Pre-oxygenation with 100% oxygen Induction Type: IV induction Ventilation: Mask ventilation without difficulty Laryngoscope Size: Miller and 2 Grade View: Grade I Tube type: Oral Tube size: 7.0 mm Number of attempts: 1 Airway Equipment and Method: Stylet and Oral airway Placement Confirmation: ETT inserted through vocal cords under direct vision,  positive ETCO2 and breath sounds checked- equal and bilateral Secured at: 24 cm Tube secured with: Tape Dental Injury: Teeth and Oropharynx as per pre-operative assessment

## 2017-06-25 NOTE — Progress Notes (Signed)
   VASCULAR SURGERY POSTOP:   Doing well post op  SUBJECTIVE:   No complaints  PHYSICAL EXAM:   Vitals:   06/25/17 1141 06/25/17 1145 06/25/17 1156 06/25/17 1200  BP: 135/81  (!) 113/59   Pulse: 87 87 81 89  Resp: 16 16 17 20   Temp:    (!) 97.4 F (36.3 C)  TempSrc:      SpO2: 99% 99% 98% 100%  Weight:      Height:       Neuro intact Incision looks fine.   LABS:   CBG (last 3)  Recent Labs    06/25/17 0620 06/25/17 1048  GLUCAP 233* 223*    PROBLEM LIST:    Active Problems:   Carotid stenosis   CURRENT MEDS:   . Chlorhexidine Gluconate Cloth  6 each Topical Once   And  . Chlorhexidine Gluconate Cloth  6 each Topical Once  . fentaNYL        Waverly FerrariChristopher Dickson Beeper: [phone] Office: [phone] 06/25/2017

## 2017-06-25 NOTE — Transfer of Care (Signed)
Immediate Anesthesia Transfer of Care Note  Patient: Isabella [name]  Procedure(s) Performed: LEFT CAROTID ENDARTERECTOMY (Left Neck) PATCH ANGIOPLASTY OF LEFT CAROTID ARTERY USING HEMASHIELD PALTINUM FINESSE PATCH (Left Neck)  Patient Location: PACU  Anesthesia Type:General  Level of Consciousness: awake and alert   Airway & Oxygen Therapy: Patient Spontanous Breathing and Patient connected to face mask oxygen  Post-op Assessment: Report given to RN and Post -op Vital signs reviewed and stable  Post vital signs: Reviewed and stable  Last Vitals:  Vitals:   06/25/17 0622 06/25/17 0623  BP:  (!) 135/46  Pulse: 83   Resp: 20   Temp: 36.7 C   SpO2: 97%     Last Pain:  Vitals:   06/25/17 0622  TempSrc: Oral         Complications: No apparent anesthesia complications

## 2017-06-26 ENCOUNTER — Other Ambulatory Visit: Payer: Self-pay

## 2017-06-26 ENCOUNTER — Encounter (HOSPITAL_COMMUNITY): Payer: Self-pay | Admitting: Vascular Surgery

## 2017-06-26 LAB — CBC
HCT: 37.9 % (ref 36.0–46.0)
HEMOGLOBIN: 12.7 g/dL (ref 12.0–15.0)
MCH: 30.5 pg (ref 26.0–34.0)
MCHC: 33.5 g/dL (ref 30.0–36.0)
MCV: 91.1 fL (ref 78.0–100.0)
PLATELETS: 205 10*3/uL (ref 150–400)
RBC: 4.16 MIL/uL (ref 3.87–5.11)
RDW: 13.4 % (ref 11.5–15.5)
WBC: 14 10*3/uL — ABNORMAL HIGH (ref 4.0–10.5)

## 2017-06-26 LAB — BASIC METABOLIC PANEL
Anion gap: 9 (ref 5–15)
BUN: 9 mg/dL (ref 6–20)
CALCIUM: 7.9 mg/dL — AB (ref 8.9–10.3)
CHLORIDE: 102 mmol/L (ref 101–111)
CO2: 23 mmol/L (ref 22–32)
CREATININE: 0.55 mg/dL (ref 0.44–1.00)
Glucose, Bld: 198 mg/dL — ABNORMAL HIGH (ref 65–99)
Potassium: 3.9 mmol/L (ref 3.5–5.1)
SODIUM: 134 mmol/L — AB (ref 135–145)

## 2017-06-26 LAB — GLUCOSE, CAPILLARY
GLUCOSE-CAPILLARY: 212 mg/dL — AB (ref 65–99)
Glucose-Capillary: 246 mg/dL — ABNORMAL HIGH (ref 65–99)

## 2017-06-26 MED ORDER — CLOPIDOGREL BISULFATE 75 MG PO TABS: 75.0000 mg | ORAL_TABLET | Freq: Every day | ORAL | 2 refills | Status: DC

## 2017-06-26 MED ORDER — OXYCODONE HCL 5 MG PO TABS: 5.0000 mg | ORAL_TABLET | ORAL | 0 refills | Status: DC | PRN

## 2017-06-26 NOTE — Progress Notes (Signed)
[facility] [place]  Subjective  - Doing OK over all, slight HA.  No amaurosis, weakness or aphasia.   Objective (!) 132/52 90 98.5 F (36.9 C) (Oral) 18 96%  Intake/Output Summary (Last 24 hours) at 06/26/2017 0739 Last data filed at 06/26/2017 0334 Gross per 24 hour  Intake 2470.83 ml  Output 1525 ml  Net 945.83 ml    Palpable radial pulses B, grip 5/5 equal B. Left neck incision healing well without hematoma. Heart RRR Lungs non labored SAT 97% RA Gen NAD  Assessment/Planning: POD # 1   Discharge home today in stable condition. F/U with Dr. Edilia Boickson in 2 weeks  Isabella [name] 06/26/2017 7:39 AM --  Laboratory Lab Results: Recent Labs    06/25/17 1521 06/26/17 0344  WBC 19.4* 14.0*  HGB 13.7 12.7  HCT 41.5 37.9  PLT 228 205   BMET Recent Labs    06/25/17 1521 06/26/17 0344  NA  --  134*  K  --  3.9  CL  --  102  CO2  --  23  GLUCOSE  --  198*  BUN  --  9  CREATININE 0.66 0.55  CALCIUM  --  7.9*    COAG Lab Results  Component Value Date   INR 0.91 06/20/2017   No results found for: PTT

## 2017-06-26 NOTE — Progress Notes (Signed)
Discharged home per order today at 15:18 with spouse and daughter.  Discharge summary and medication list reviewed with patient and signed.  IV L arm removed w/o difficulty.  Pt assisted to vehicle by staff.    Eugenio HoesMichelle Sabine Tenenbaum, RN

## 2017-06-26 NOTE — Progress Notes (Signed)
Inpatient Diabetes Program Recommendations  AACE/ADA: New Consensus Statement on Inpatient Glycemic Control (2015)  Target Ranges:  Prepandial:   less than 140 mg/dL      Peak postprandial:   less than 180 mg/dL (1-2 hours)      Critically ill patients:  140 - 180 mg/dL   Lab Results  Component Value Date   GLUCAP 246 (H) 06/26/2017   HGBA1C 9.2 (H) 06/25/2017    Review of Glycemic Control  Diabetes history: DM2 Outpatient Diabetes medications: Glucotrol 10 mg bid  Inpatient Diabetes Program Recommendations:   Spoke with pt about  A1C 9.2 (approximately 220 over the past 2-3 months) results with them and explained what an A1C is, basic pathophysiology of DM Type 2, basic home care, basic diabetes diet nutrition principles, importance of checking CBGs and maintaining good CBG control to prevent long-term and short-term complications. Reviewed signs and symptoms of hyperglycemia and hypoglycemia and how to treat hypoglycemia at home. Also reviewed blood sugar goals at home.  RNs to provide ongoing basic DM education at bedside with this patient.  Patient states she no longer has a meter and strips and needs an order on discharge home. Patient states willingness to start on insulin as needed. Discharge needs:  Meter & strips [id number]  Thank you, Billy FischerJudy E. Juniel Groene, RN, MSN, CDE  Diabetes Coordinator Inpatient Glycemic Control Team Team Pager [phone] (8am-5pm) 06/26/2017 11:34 AM

## 2017-06-26 NOTE — Care Management Note (Signed)
Case Management Note Donn PieriniKristi Ariell Gunnels RN, BSN Unit 4E-Case Manager [phone]  Patient Details  Name: Purcell MoutonLisa Rae Bramlett MRN: [id number] Date of Birth: 1965-06-29  Subjective/Objective:   Pt admitted s/p CEA                 Action/Plan: PTA pt lived at home, independent- plan to return home- no CM needs noted for transition to home  Expected Discharge Date:  06/26/17               Expected Discharge Plan:  Home/Self Care  In-House Referral:  NA  Discharge planning Services  CM Consult, NA  Post Acute Care Choice:  NA Choice offered to:  NA  DME Arranged:    DME Agency:     HH Arranged:    HH Agency:     Status of Service:  Completed, signed off  If discussed at Long Length of Stay Meetings, dates discussed:    Discharge Disposition: home/self care   Additional Comments:  Darrold SpanWebster, Coco Sharpnack Hall, RN 06/26/2017, 11:45 AM

## 2017-06-27 NOTE — Discharge Summary (Signed)
[facility] Discharge Summary   Patient ID:  Isabella [name] MRN: [id number] DOB/AGE: Jul 21, 1965 52 y.o.  Admit date: 06/25/2017 Discharge date: 06/26/2017 Date of Surgery: 06/25/2017 Surgeon: Surgeon(s): Chuck Hintickson, Christopher S, MD  Admission Diagnosis: left carotid stenosis  Discharge Diagnoses:  left carotid stenosis  Secondary Diagnoses: Past Medical History:  Diagnosis Date  . Arthritis    "lower back, knees, elbows"  . Complication of anesthesia 2013   Patient stated that she could not  "wake up from anesthesia and coded" during [place] [facility] [place]  . COPD (chronic obstructive pulmonary disease) (HCC)    patient states she has been diagnosed with COPD "but does not use any inhalers"  . Diabetes mellitus without complication (HCC)   . GERD (gastroesophageal reflux disease)    diet controlled  . History of hiatal hernia   . Peripheral vascular disease (HCC)   . Pneumonia   . Poison sumac    acquired it approx on Friday.  On back, below left shoulder and under breasts  . Stroke (HCC)     Procedure(s): LEFT CAROTID ENDARTERECTOMY PATCH ANGIOPLASTY OF LEFT CAROTID ARTERY USING HEMASHIELD PALTINUM FINESSE PATCH  Discharged Condition: good  HPI: 52 y/o female with asymptomatic carotid stenosis left ICA.  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 We scheduled her for left CEA.    Hospital Course:  Isabella [name] is a 52 y.o. female is S/P Procedure(s): LEFT CAROTID ENDARTERECTOMY PATCH ANGIOPLASTY OF LEFT CAROTID ARTERY USING HEMASHIELD PALTINUM FINESSE  PATCH  Post op her chief complaint was incisional pain and mild HA.  He HA comes and goes, I do not think this is a reperfusion HA. No neurologic deficits, no tongue deviation and smile is symmetric.   Disposition stable for discharge.  She was given #6 oxycodone at discharge for pain control, 81 mg aspirin and 75 mg Plavix.  F/U with Dr. Edilia Boickson in 2 weeks.    Significant Diagnostic Studies: CBC Lab Results  Component Value Date   WBC 14.0 (H) 06/26/2017   HGB 12.7 06/26/2017   HCT 37.9 06/26/2017   MCV 91.1 06/26/2017   PLT 205 06/26/2017    BMET    Component Value Date/Time   NA 134 (L) 06/26/2017 0344   K 3.9 06/26/2017 0344   CL 102 06/26/2017 0344   CO2 23 06/26/2017 0344   GLUCOSE 198 (H) 06/26/2017 0344   BUN 9 06/26/2017 0344   CREATININE 0.55 06/26/2017 0344   CALCIUM 7.9 (L) 06/26/2017 0344   GFRNONAA >60 06/26/2017 0344   GFRAA >60 06/26/2017 0344   COAG Lab Results  Component Value Date   INR 0.91 06/20/2017     Disposition:  Discharge to :Home Discharge Instructions    Call MD for:  redness, tenderness, or signs of infection (pain, swelling, bleeding, redness, odor or green/yellow discharge around incision site)   Complete by:  As directed    Call MD for:  severe or increased pain, loss or decreased feeling  in affected limb(s)   Complete by:  As directed    Call MD for:  temperature >100.5  Complete by:  As directed    Resume previous diet   Complete by:  As directed      Allergies as of 06/26/2017      Reactions   Statins Other (See Comments)   Muscle weakness.      Medication List    TAKE these medications   aspirin 81 MG chewable tablet Chew 81 mg by mouth daily.   clopidogrel 75 MG tablet Commonly known as:  PLAVIX Take 1 tablet (75 mg total) by mouth daily.   glipiZIDE 10 MG tablet Commonly known as:  GLUCOTROL Take 10 mg by mouth 2 (two) times daily.   oxyCODONE 5 MG immediate release tablet Commonly known as:  Oxy  IR/ROXICODONE Take 1 tablet (5 mg total) by mouth every 4 (four) hours as needed for moderate pain.      Verbal and written Discharge instructions given to the patient. Wound care per Discharge AVS   Signed: Mosetta Pigeon 06/27/2017, 8:59 AM --- For VQI Registry use --- Instructions: Press F2 to tab through selections.  Delete question if not applicable.   Modified Rankin score at D/C (0-6): Rankin Score=0  IV medication needed for:  1. Hypertension: No 2. Hypotension: No  Post-op Complications: No  1. Post-op CVA or TIA: No  If yes: Event classification (right eye, left eye, right cortical, left cortical, verterobasilar, other):   If yes: Timing of event (intra-op, <6 hrs post-op, >=6 hrs post-op, unknown):   2. CN injury: No  If yes: CN  injuried   3. Myocardial infarction: No  If yes: Dx by (EKG or clinical, Troponin):   4.  CHF: No  5.  Dysrhythmia (new): No  6. Wound infection: No  7. Reperfusion symptoms: No  8. Return to OR: No  If yes: return to OR for (bleeding, neurologic, other CEA incision, other):   Discharge medications: Statin use:  No  for medical reason   ASA use:  Yes Beta blocker use:  No  for medical reason   ACE-Inhibitor use:  No  for medical reason   P2Y12 Antagonist use: [ ]  None, [x ] Plavix, [ ]  Plasugrel, [ ]  Ticlopinine, [ ]  Ticagrelor, [ ]  Other, [ ]  No for medical reason, [ ]  Non-compliant, [ ]  Not-indicated Anti-coagulant use:  [ x] None, [ ]  Warfarin, [ ]  Rivaroxaban, [ ]  Dabigatran, [ ]  Other, [ ]  No for medical reason, [ ]  Non-compliant, [ ]  Not-indicated

## 2017-06-28 ENCOUNTER — Encounter: Payer: Self-pay | Admitting: Vascular Surgery

## 2017-06-28 ENCOUNTER — Encounter (HOSPITAL_COMMUNITY): Payer: Self-pay

## 2017-07-05 DIAGNOSIS — Z91199 Patient's noncompliance with other medical treatment and regimen due to unspecified reason: Secondary | ICD-10-CM | POA: Insufficient documentation

## 2017-07-30 ENCOUNTER — Telehealth: Payer: Self-pay | Admitting: *Deleted

## 2017-07-30 NOTE — Telephone Encounter (Signed)
Patient called to discuss postop questions including: how long will she take Plavix, etc. Upon receiving the call, found that we have not seen her for a postop appt. Her left CEA was done on 06-25-17 and she was discharged on 06-26-17. We have not seen her since, d/t this I have made her an appt tomorrow to see the PA and also speak to Dr. Edilia Boickson.

## 2017-07-31 ENCOUNTER — Ambulatory Visit (INDEPENDENT_AMBULATORY_CARE_PROVIDER_SITE_OTHER): Payer: Self-pay | Admitting: Physician Assistant

## 2017-07-31 ENCOUNTER — Encounter: Payer: Self-pay | Admitting: *Deleted

## 2017-07-31 ENCOUNTER — Telehealth: Payer: Self-pay | Admitting: *Deleted

## 2017-07-31 VITALS — BP 104/71 | HR 99 | Temp 97.3°F | Resp 16 | Ht 61.0 in | Wt 146.6 lb

## 2017-07-31 DIAGNOSIS — Z9889 Other specified postprocedural states: Secondary | ICD-10-CM

## 2017-07-31 NOTE — H&P (View-Only) (Signed)
HISTORY AND PHYSICAL     CC:  Follow up after carotid surgery Requesting Provider:  [name], MD  HPI: This is a 52 y.o. female who has a very complicated surgical hx.  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  She underwent left carotid endarterectomy by Dr. Edilia Bo on 06/25/17.  She states that she has done well with this surgery.  She states that her mind is clearer, however, she is still having dizzy spells. She states that both her legs are weak and feel as if they are going to give out but she does not having any claudication sx.  [place] and [place] the hill to feed the horses and ducks at the pond.    Upon talking with her further today, she states that she had a bypass on the right from the "main aorta up to the carotid" with what sounds like cadaver vein or graft.    She continues to smoke.  She states that she did quit smoking for a couple of weeks perioperatively.   She states that her sister had her entire aorta replaced due to aneurysm and still has problems with blockages in her arms.  She states that her brother died at age 56 with disease.  She states that her diabetes is uncontrolled and she is unable to afford her insulin.  Past Medical History:  Diagnosis Date  . Arthritis    "lower back, knees, elbows"  . Complication of anesthesia 2013   Patient stated that she could not  "wake up from anesthesia and coded" during aorta surgery in [facility]  . COPD (chronic obstructive pulmonary disease) (HCC)    patient states she  has been diagnosed with COPD "but does not use any inhalers"  . Diabetes mellitus without complication (HCC)   . GERD (gastroesophageal reflux disease)    diet controlled  . History of hiatal hernia   . Peripheral vascular disease (HCC)   . Pneumonia   . Poison sumac    acquired it approx on Friday.  On back, below left shoulder and under breasts  . Stroke [facility])     Past Surgical History:  Procedure Laterality Date  . ABDOMINAL HYSTERECTOMY    . AORTA SURGERY     with sent placement  . CAROTID ENDARTERECTOMY Right   . CYSTOCELE REPAIR    . ENDARTERECTOMY Left 06/25/2017   Procedure: LEFT CAROTID ENDARTERECTOMY;  Surgeon: Chuck Hint, MD;  Location: [facility] OR;  Service: Vascular;  Laterality: Left;  . PATCH ANGIOPLASTY Left 06/25/2017   Procedure: PATCH ANGIOPLASTY OF LEFT CAROTID ARTERY USING HEMASHIELD PALTINUM FINESSE PATCH;  Surgeon: Chuck Hint, MD;  Location: [facility] OR;  Service: Vascular;  Laterality: Left;  . RECTOCELE REPAIR     x2    Allergies  Allergen Reactions  . Statins Other (See Comments)    Muscle weakness.    Current Outpatient Medications  Medication Sig Dispense Refill  . aspirin 81 MG chewable tablet Chew 81 mg by mouth daily.    . clopidogrel (PLAVIX) 75 MG tablet Take 1  tablet (75 mg total) by mouth daily. 30 tablet 2  . glipiZIDE (GLUCOTROL) 10 MG tablet Take 10 mg by mouth 2 (two) times daily.     Marland Kitchen oxyCODONE (OXY IR/ROXICODONE) 5 MG immediate release tablet Take 1 tablet (5 mg total) by mouth every 4 (four) hours as needed for moderate pain. (Patient not taking: Reported on 07/31/2017) 6 tablet 0   No current facility-administered medications for this visit.     Family History:  See HPI Family History  Problem Relation Age of Onset  . AAA (abdominal aortic aneurysm) Sister   . Peripheral Artery Disease Brother      Social History   Socioeconomic History  . Marital status: Divorced    Spouse name: Not on file  . Number of  children: Not on file  . Years of education: Not on file  . Highest education level: Not on file  Social Needs  . Financial resource strain: Not on file  . Food insecurity - worry: Not on file  . Food insecurity - inability: Not on file  . Transportation needs - medical: Not on file  . Transportation needs - non-medical: Not on file  Occupational History  . Not on file  Tobacco Use  . Smoking status: Current Every Day Smoker    Packs/day: 1.00    Types: Cigarettes  . Smokeless tobacco: Never Used  Substance and Sexual Activity  . Alcohol use: No    Frequency: Never  . Drug use: No  . Sexual activity: Not on file  Other Topics Concern  . Not on file  Social History Narrative  . Not on file     REVIEW OF SYSTEMS:   [X]  denotes positive finding, [ ]  denotes negative finding Cardiac  Comments:  Chest pain or chest pressure:    Shortness of breath upon exertion:    Short of breath when lying flat:    Irregular heart rhythm:        Vascular    Pain in calf, thigh, or hip brought on by ambulation:    Pain in feet at night that wakes you up from your sleep:     Blood clot in your veins:    Leg swelling:         Pulmonary    Oxygen at home:    Productive cough:     Wheezing:         Neurologic    Sudden weakness in arms or legs:  x Legs bilaterally; see HPI  Sudden numbness in arms or legs:     Sudden onset of difficulty speaking or slurred speech:    Temporary loss of vision in one eye:     Problems with dizziness:  x       Gastrointestinal    Blood in stool:     Vomited blood:         Genitourinary    Burning when urinating:     Blood in urine:        Psychiatric    Major depression:         Hematologic    Bleeding problems:    Problems with blood clotting too easily:        Skin    Rashes or ulcers:        Constitutional    Fever or chills:      PHYSICAL EXAMINATION:  Vitals:   07/31/17 1447 07/31/17 1450  BP: 127/81 104/71  Pulse: 99     Resp:  16   Temp: (!) 97.3 F (36.3 C)   SpO2: 95%    Vitals:   07/31/17 1447  Weight: 146 lb 9.6 oz (66.5 kg)  Height: 5\' 1"  (1.549 m)   Body mass index is 27.7 kg/m.  General:  WDWN in NAD; vital signs documented above Gait: Not observed HENT: WNL, normocephalic Pulmonary: normal non-labored breathing , without Rales, rhonchi,  wheezing Cardiac: regular HR, without  Murmurs with right carotid bruit Abdomen: soft, NT, no palpable masses felt Skin: without rashes; well healed bilateral neck incisions. Vascular Exam/Pulses:  Right Left  Radial 2+ (normal) 2+ (normal)  Ulnar 2+ (normal) 2+ (normal)  Femoral 2+ (normal) 2+ (normal)   Extremities: without ischemic changes, without Gangrene , without cellulitis; without open wounds;  Musculoskeletal: no muscle wasting or atrophy  Neurologic: A&O X 3;  No focal weakness or paresthesias are detected Psychiatric:  The pt has Normal affect.   Non-Invasive Vascular Imaging:   None today  CTA 06/21/17: IMPRESSION: 1. 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 2. Severe, near occlusive stenosis of the proximal left internal carotid artery, with luminal diameter of 1 mm and stenosis measuring 90% by NASCET criteria. 3. 55% stenosis of the proximal right internal carotid artery by NASCET criteria. 4. Moderate-to-severe narrowing of the cavernous and ophthalmic segments of both internal carotid arteries at the skull base. 5. No intracranial arterial occlusion or high-grade stenosis.   Pt meds includes: Statin:  No. intolerance Beta Blocker:  No. Aspirin:  Yes.   ACEI:  No. ARB:  No. CCB use:  No Other Antiplatelet/Anticoagulant:  Yes Plavix   ASSESSMENT/PLAN:: 52 y.o. female who is s/p left carotid endarterectomy on 06/25/17 by Dr. Edilia Boickson who continues to have issues with dizziness.  She has a very  complicated surgical hx.  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    Upon talking with her further today, she states that she had a bypass on the right from the "main aorta up to the carotid" with what sounds like cadaver vein or graft.  She continues to have problems with dizziness.     -Dr. Edilia Boickson reviewed the pt's CTA and spoke with the pt.  She has done well from her left carotid endarterectomy but still has complaints of dizziness.  Given the fact that she has  had a bypass on the right, Dr. Edilia Boickson discussed with the pt about undergoing a selective carotid angiogram to determine further treatment/intervention as the stenosis on the right could be contributing to her dizziness.  She has also stated she has had some weakness in her legs bilaterally.  Will also get aortogram with possible runoff to evaluate arterial anatomy.  -will schedule this for 08/16/17.   -discussed importance of healthy eating, smoking cessation, exercise and controlling her diabetes.   Doreatha MassedSamantha Dozier Berkovich, PA-C [facility] [phone]  Clinic MD:  Pt seen and examined with Dr. Edilia Boickson

## 2017-07-31 NOTE — Progress Notes (Signed)
Patient called and informed of time change for procedure. [facility] at 8am on 08/16/2017. All other pre-procedure instructions as per letter received today. Verbalized understanding.

## 2017-07-31 NOTE — Telephone Encounter (Signed)
I called Isabella [name] and notified her that procedure time changed [facility] am on [facility]. Follow the instructions provided in the letter given to her today. Verbalized understanding.

## 2017-07-31 NOTE — Progress Notes (Signed)
HISTORY AND PHYSICAL     CC:  Follow up after carotid surgery Requesting Provider:  [name], MD  HPI: This is a 52 y.o. female who has a very complicated surgical hx.  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  She underwent left carotid endarterectomy by Dr. Edilia Bo on 06/25/17.  She states that she has done well with this surgery.  She states that her mind is clearer, however, she is still having dizzy spells. She states that both her legs are weak and feel as if they are going to give out but she does not having any claudication sx.  [place] and [place] the hill to feed the horses and ducks at the pond.    Upon talking with her further today, she states that she had a bypass on the right from the "main aorta up to the carotid" with what sounds like cadaver vein or graft.    She continues to smoke.  She states that she did quit smoking for a couple of weeks perioperatively.   She states that her sister had her entire aorta replaced due to aneurysm and still has problems with blockages in her arms.  She states that her brother died at age 56 with disease.  She states that her diabetes is uncontrolled and she is unable to afford her insulin.  Past Medical History:  Diagnosis Date  . Arthritis    "lower back, knees, elbows"  . Complication of anesthesia 2013   Patient stated that she could not  "wake up from anesthesia and coded" during aorta surgery in [facility]  . COPD (chronic obstructive pulmonary disease) (HCC)    patient states she  has been diagnosed with COPD "but does not use any inhalers"  . Diabetes mellitus without complication (HCC)   . GERD (gastroesophageal reflux disease)    diet controlled  . History of hiatal hernia   . Peripheral vascular disease (HCC)   . Pneumonia   . Poison sumac    acquired it approx on Friday.  On back, below left shoulder and under breasts  . Stroke [facility])     Past Surgical History:  Procedure Laterality Date  . ABDOMINAL HYSTERECTOMY    . AORTA SURGERY     with sent placement  . CAROTID ENDARTERECTOMY Right   . CYSTOCELE REPAIR    . ENDARTERECTOMY Left 06/25/2017   Procedure: LEFT CAROTID ENDARTERECTOMY;  Surgeon: Chuck Hint, MD;  Location: [facility] OR;  Service: Vascular;  Laterality: Left;  . PATCH ANGIOPLASTY Left 06/25/2017   Procedure: PATCH ANGIOPLASTY OF LEFT CAROTID ARTERY USING HEMASHIELD PALTINUM FINESSE PATCH;  Surgeon: Chuck Hint, MD;  Location: [facility] OR;  Service: Vascular;  Laterality: Left;  . RECTOCELE REPAIR     x2    Allergies  Allergen Reactions  . Statins Other (See Comments)    Muscle weakness.    Current Outpatient Medications  Medication Sig Dispense Refill  . aspirin 81 MG chewable tablet Chew 81 mg by mouth daily.    . clopidogrel (PLAVIX) 75 MG tablet Take 1  tablet (75 mg total) by mouth daily. 30 tablet 2  . glipiZIDE (GLUCOTROL) 10 MG tablet Take 10 mg by mouth 2 (two) times daily.     Marland Kitchen oxyCODONE (OXY IR/ROXICODONE) 5 MG immediate release tablet Take 1 tablet (5 mg total) by mouth every 4 (four) hours as needed for moderate pain. (Patient not taking: Reported on 07/31/2017) 6 tablet 0   No current facility-administered medications for this visit.     Family History:  See HPI Family History  Problem Relation Age of Onset  . AAA (abdominal aortic aneurysm) Sister   . Peripheral Artery Disease Brother      Social History   Socioeconomic History  . Marital status: Divorced    Spouse name: Not on file  . Number of  children: Not on file  . Years of education: Not on file  . Highest education level: Not on file  Social Needs  . Financial resource strain: Not on file  . Food insecurity - worry: Not on file  . Food insecurity - inability: Not on file  . Transportation needs - medical: Not on file  . Transportation needs - non-medical: Not on file  Occupational History  . Not on file  Tobacco Use  . Smoking status: Current Every Day Smoker    Packs/day: 1.00    Types: Cigarettes  . Smokeless tobacco: Never Used  Substance and Sexual Activity  . Alcohol use: No    Frequency: Never  . Drug use: No  . Sexual activity: Not on file  Other Topics Concern  . Not on file  Social History Narrative  . Not on file     REVIEW OF SYSTEMS:   [X]  denotes positive finding, [ ]  denotes negative finding Cardiac  Comments:  Chest pain or chest pressure:    Shortness of breath upon exertion:    Short of breath when lying flat:    Irregular heart rhythm:        Vascular    Pain in calf, thigh, or hip brought on by ambulation:    Pain in feet at night that wakes you up from your sleep:     Blood clot in your veins:    Leg swelling:         Pulmonary    Oxygen at home:    Productive cough:     Wheezing:         Neurologic    Sudden weakness in arms or legs:  x Legs bilaterally; see HPI  Sudden numbness in arms or legs:     Sudden onset of difficulty speaking or slurred speech:    Temporary loss of vision in one eye:     Problems with dizziness:  x       Gastrointestinal    Blood in stool:     Vomited blood:         Genitourinary    Burning when urinating:     Blood in urine:        Psychiatric    Major depression:         Hematologic    Bleeding problems:    Problems with blood clotting too easily:        Skin    Rashes or ulcers:        Constitutional    Fever or chills:      PHYSICAL EXAMINATION:  Vitals:   07/31/17 1447 07/31/17 1450  BP: 127/81 104/71  Pulse: 99     Resp:  16   Temp: (!) 97.3 F (36.3 C)   SpO2: 95%    Vitals:   07/31/17 1447  Weight: 146 lb 9.6 oz (66.5 kg)  Height: 5\' 1"  (1.549 m)   Body mass index is 27.7 kg/m.  General:  WDWN in NAD; vital signs documented above Gait: Not observed HENT: WNL, normocephalic Pulmonary: normal non-labored breathing , without Rales, rhonchi,  wheezing Cardiac: regular HR, without  Murmurs with right carotid bruit Abdomen: soft, NT, no palpable masses felt Skin: without rashes; well healed bilateral neck incisions. Vascular Exam/Pulses:  Right Left  Radial 2+ (normal) 2+ (normal)  Ulnar 2+ (normal) 2+ (normal)  Femoral 2+ (normal) 2+ (normal)   Extremities: without ischemic changes, without Gangrene , without cellulitis; without open wounds;  Musculoskeletal: no muscle wasting or atrophy  Neurologic: A&O X 3;  No focal weakness or paresthesias are detected Psychiatric:  The pt has Normal affect.   Non-Invasive Vascular Imaging:   None today  CTA 06/21/17: IMPRESSION: 1. 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 2. Severe, near occlusive stenosis of the proximal left internal carotid artery, with luminal diameter of 1 mm and stenosis measuring 90% by NASCET criteria. 3. 55% stenosis of the proximal right internal carotid artery by NASCET criteria. 4. Moderate-to-severe narrowing of the cavernous and ophthalmic segments of both internal carotid arteries at the skull base. 5. No intracranial arterial occlusion or high-grade stenosis.   Pt meds includes: Statin:  No. intolerance Beta Blocker:  No. Aspirin:  Yes.   ACEI:  No. ARB:  No. CCB use:  No Other Antiplatelet/Anticoagulant:  Yes Plavix   ASSESSMENT/PLAN:: 52 y.o. female who is s/p left carotid endarterectomy on 06/25/17 by Dr. Edilia Boickson who continues to have issues with dizziness.  She has a very  complicated surgical hx.  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    Upon talking with her further today, she states that she had a bypass on the right from the "main aorta up to the carotid" with what sounds like cadaver vein or graft.  She continues to have problems with dizziness.     -Dr. Edilia Boickson reviewed the pt's CTA and spoke with the pt.  She has done well from her left carotid endarterectomy but still has complaints of dizziness.  Given the fact that she has  had a bypass on the right, Dr. Edilia Boickson discussed with the pt about undergoing a selective carotid angiogram to determine further treatment/intervention as the stenosis on the right could be contributing to her dizziness.  She has also stated she has had some weakness in her legs bilaterally.  Will also get aortogram with possible runoff to evaluate arterial anatomy.  -will schedule this for 08/16/17.   -discussed importance of healthy eating, smoking cessation, exercise and controlling her diabetes.   Doreatha MassedSamantha Olita Takeshita, PA-C [facility] [phone]  Clinic MD:  Pt seen and examined with Dr. Edilia Boickson

## 2017-08-01 ENCOUNTER — Other Ambulatory Visit: Payer: Self-pay

## 2017-08-01 DIAGNOSIS — I6522 Occlusion and stenosis of left carotid artery: Secondary | ICD-10-CM

## 2017-08-01 DIAGNOSIS — Z9889 Other specified postprocedural states: Secondary | ICD-10-CM

## 2017-08-05 ENCOUNTER — Other Ambulatory Visit: Payer: Self-pay | Admitting: *Deleted

## 2017-08-16 ENCOUNTER — Encounter (HOSPITAL_COMMUNITY)
Admission: RE | Disposition: A | Discharge status: Home / Self Care | Payer: Self-pay | Source: Ambulatory Visit | Attending: Vascular Surgery

## 2017-08-16 ENCOUNTER — Ambulatory Visit (HOSPITAL_COMMUNITY)
Admission: RE | Admit: 2017-08-16 | Discharge: 2017-08-16 | Disposition: A | Discharge status: Home / Self Care | Payer: Medicare PPO | Source: Ambulatory Visit | Attending: Vascular Surgery | Admitting: Vascular Surgery

## 2017-08-16 DIAGNOSIS — J449 Chronic obstructive pulmonary disease, unspecified: Secondary | ICD-10-CM | POA: Insufficient documentation

## 2017-08-16 DIAGNOSIS — M1990 Unspecified osteoarthritis, unspecified site: Secondary | ICD-10-CM | POA: Insufficient documentation

## 2017-08-16 DIAGNOSIS — K219 Gastro-esophageal reflux disease without esophagitis: Secondary | ICD-10-CM | POA: Diagnosis not present

## 2017-08-16 DIAGNOSIS — I6521 Occlusion and stenosis of right carotid artery: Secondary | ICD-10-CM | POA: Diagnosis not present

## 2017-08-16 DIAGNOSIS — I739 Peripheral vascular disease, unspecified: Secondary | ICD-10-CM

## 2017-08-16 DIAGNOSIS — Z7984 Long term (current) use of oral hypoglycemic drugs: Secondary | ICD-10-CM | POA: Insufficient documentation

## 2017-08-16 DIAGNOSIS — R42 Dizziness and giddiness: Secondary | ICD-10-CM | POA: Diagnosis present

## 2017-08-16 DIAGNOSIS — I69354 Hemiplegia and hemiparesis following cerebral infarction affecting left non-dominant side: Secondary | ICD-10-CM | POA: Insufficient documentation

## 2017-08-16 DIAGNOSIS — E1151 Type 2 diabetes mellitus with diabetic peripheral angiopathy without gangrene: Secondary | ICD-10-CM | POA: Diagnosis not present

## 2017-08-16 DIAGNOSIS — Z7982 Long term (current) use of aspirin: Secondary | ICD-10-CM | POA: Insufficient documentation

## 2017-08-16 DIAGNOSIS — F17210 Nicotine dependence, cigarettes, uncomplicated: Secondary | ICD-10-CM | POA: Diagnosis not present

## 2017-08-16 HISTORY — PX: AORTIC ARCH ANGIOGRAPHY: CATH118224

## 2017-08-16 HISTORY — PX: ABDOMINAL AORTOGRAM W/LOWER EXTREMITY: CATH118223

## 2017-08-16 LAB — POCT I-STAT, CHEM 8
BUN: 17 mg/dL (ref 6–20)
CHLORIDE: 102 mmol/L (ref 101–111)
Calcium, Ion: 1.08 mmol/L — ABNORMAL LOW (ref 1.15–1.40)
Creatinine, Ser: 0.6 mg/dL (ref 0.44–1.00)
Glucose, Bld: 262 mg/dL — ABNORMAL HIGH (ref 65–99)
HEMATOCRIT: 44 % (ref 36.0–46.0)
Hemoglobin: 15 g/dL (ref 12.0–15.0)
POTASSIUM: 5.4 mmol/L — AB (ref 3.5–5.1)
SODIUM: 136 mmol/L (ref 135–145)
TCO2: 29 mmol/L (ref 22–32)

## 2017-08-16 LAB — GLUCOSE, CAPILLARY
GLUCOSE-CAPILLARY: 201 mg/dL — AB (ref 65–99)
Glucose-Capillary: 253 mg/dL — ABNORMAL HIGH (ref 65–99)

## 2017-08-16 SURGERY — ABDOMINAL AORTOGRAM W/LOWER EXTREMITY
Anesthesia: LOCAL | Laterality: Right

## 2017-08-16 MED ORDER — HEPARIN (PORCINE) IN NACL 2-0.9 UNIT/ML-% IJ SOLN
INTRAMUSCULAR | Status: AC
  Filled 2017-08-16: qty 1000

## 2017-08-16 MED ORDER — ACETAMINOPHEN 325 MG PO TABS
650.0000 mg | ORAL_TABLET | ORAL | Status: DC | PRN
  Administered 2017-08-16: 650 mg via ORAL

## 2017-08-16 MED ORDER — SODIUM CHLORIDE 0.9% FLUSH: 3.0000 mL | INTRAVENOUS | Status: DC | PRN

## 2017-08-16 MED ORDER — ACETAMINOPHEN 325 MG PO TABS
ORAL_TABLET | ORAL | Status: AC
  Administered 2017-08-16: 650 mg via ORAL
  Filled 2017-08-16: qty 2

## 2017-08-16 MED ORDER — HYDRALAZINE HCL 20 MG/ML IJ SOLN
INTRAMUSCULAR | Status: AC
  Filled 2017-08-16: qty 1

## 2017-08-16 MED ORDER — ONDANSETRON HCL 4 MG/2ML IJ SOLN: 4.0000 mg | Freq: Four times a day (QID) | INTRAMUSCULAR | Status: DC | PRN

## 2017-08-16 MED ORDER — SODIUM CHLORIDE 0.9 % IV SOLN
INTRAVENOUS | Status: DC
  Administered 2017-08-16: 09:00:00 via INTRAVENOUS

## 2017-08-16 MED ORDER — LABETALOL HCL 5 MG/ML IV SOLN
INTRAVENOUS | Status: AC
  Filled 2017-08-16: qty 4

## 2017-08-16 MED ORDER — SODIUM CHLORIDE 0.9 % WEIGHT BASED INFUSION: 1.0000 mL/kg/h | INTRAVENOUS | Status: DC

## 2017-08-16 MED ORDER — HEPARIN (PORCINE) IN NACL 2-0.9 UNIT/ML-% IJ SOLN
INTRAMUSCULAR | Status: AC | PRN
  Administered 2017-08-16: 1000 mL

## 2017-08-16 MED ORDER — SODIUM CHLORIDE 0.9 % IV SOLN: 250.0000 mL | INTRAVENOUS | Status: DC | PRN

## 2017-08-16 MED ORDER — IODIXANOL 320 MG/ML IV SOLN
INTRAVENOUS | Status: DC | PRN
  Administered 2017-08-16: 201 mL via INTRA_ARTERIAL

## 2017-08-16 MED ORDER — LIDOCAINE HCL 1 % IJ SOLN
INTRAMUSCULAR | Status: AC
  Filled 2017-08-16: qty 20

## 2017-08-16 MED ORDER — LIDOCAINE HCL (PF) 1 % IJ SOLN
INTRAMUSCULAR | Status: DC | PRN
  Administered 2017-08-16: 10 mL

## 2017-08-16 MED ORDER — LABETALOL HCL 5 MG/ML IV SOLN
10.0000 mg | INTRAVENOUS | Status: DC | PRN
  Administered 2017-08-16: 10 mg via INTRAVENOUS

## 2017-08-16 MED ORDER — LABETALOL HCL 5 MG/ML IV SOLN
INTRAVENOUS | Status: DC | PRN
  Administered 2017-08-16: 10 mg via INTRAVENOUS

## 2017-08-16 MED ORDER — HYDRALAZINE HCL 20 MG/ML IJ SOLN
5.0000 mg | INTRAMUSCULAR | Status: DC | PRN
  Administered 2017-08-16: 5 mg via INTRAVENOUS

## 2017-08-16 MED ORDER — SODIUM CHLORIDE 0.9% FLUSH: 3.0000 mL | Freq: Two times a day (BID) | INTRAVENOUS | Status: DC

## 2017-08-16 SURGICAL SUPPLY — 13 items
CATH ANGIO 5F PIGTAIL 100CM (CATHETERS) ×4 IMPLANT
CATH HEADHUNTER H1 5F 100CM (CATHETERS) ×4 IMPLANT
COVER PRB 48X5XTLSCP FOLD TPE (BAG) ×3 IMPLANT
COVER PROBE 5X48 (BAG) ×1
KIT MICROPUNCTURE NIT STIFF (SHEATH) ×4 IMPLANT
KIT PV (KITS) ×4 IMPLANT
SHEATH AVANTI 11CM 5FR (SHEATH) ×4 IMPLANT
STOPCOCK MORSE 400PSI 3WAY (MISCELLANEOUS) ×4 IMPLANT
SYRINGE MEDRAD AVANTA MACH 7 (SYRINGE) ×4 IMPLANT
TRANSDUCER W/STOPCOCK (MISCELLANEOUS) ×4 IMPLANT
TRAY PV CATH (CUSTOM PROCEDURE TRAY) ×4 IMPLANT
TUBING CIL FLEX 10 FLL-RA (TUBING) ×4 IMPLANT
WIRE BENTSON .035X145CM (WIRE) ×4 IMPLANT

## 2017-08-16 NOTE — Progress Notes (Addendum)
Site area: RFA Site Prior to Removal:  Level 0 Pressure Applied For:20 min Manual: yes Patient Status During Pull:  stable Post Pull Site:  Level 0 Post Pull Instructions Given:  yes Post Pull Pulses Present: palpable Dressing Applied:  clear Bedrest begins @ 1230 till 1630 Comments:

## 2017-08-16 NOTE — Interval H&P Note (Signed)
History and Physical Interval Note:  08/16/2017 10:14 AM  Purcell MoutonLisa Rae [name]  has presented today for surgery, with the diagnosis of pvd; right carotid stenosis  The various methods of treatment have been discussed with the patient and family. After consideration of risks, benefits and other options for treatment, the patient has consented to  Procedure(s): ABDOMINAL AORTOGRAM W/LOWER EXTREMITY (N/A) CAROTID ANGIOGRAPHY (Right) as a surgical intervention .  The patient's history has been reviewed, patient examined, no change in status, stable for surgery.  I have reviewed the patient's chart and labs.  Questions were answered to the patient's satisfaction.     [name]

## 2017-08-16 NOTE — Interval H&P Note (Signed)
History and Physical Interval Note:  08/16/2017 10:15 AM  Purcell MoutonLisa Rae [name]  has presented today for surgery, with the diagnosis of pvd; right carotid stenosis  The various methods of treatment have been discussed with the patient and family. After consideration of risks, benefits and other options for treatment, the patient has consented to  Procedure(s): ABDOMINAL AORTOGRAM W/LOWER EXTREMITY (N/A) CAROTID ANGIOGRAPHY (Right) as a surgical intervention .  The patient's history has been reviewed, patient examined, no change in status, stable for surgery.  I have reviewed the patient's chart and labs.  Questions were answered to the patient's satisfaction.     [name]

## 2017-08-16 NOTE — Discharge Instructions (Signed)
° °  [facility] [place] ° °Discharge Instructions ° °Lower Extremity Angiogram; Angioplasty/Stenting ° °Please refer to the following instructions for your post-procedure care. Your surgeon or physician assistant will discuss any changes with you. ° °Activity ° °Avoid lifting more than 8 pounds (1 gallons of milk) for 72 hours (3 days) after your procedure. You may walk as much as you can tolerate. It's OK to drive after 72 hours. ° °Bathing/Showering ° °You may shower the day after your procedure. If you have a bandage, you may remove it at 24- 48 hours. Clean your incision site with mild soap and water. Pat the area dry with a clean towel. ° °Diet ° °Resume your pre-procedure diet. There are no special food restrictions following this procedure. All patients with peripheral vascular disease should follow a low fat/low cholesterol diet. In order to heal from your surgery, it is CRITICAL to get adequate nutrition. Your body requires vitamins, minerals, and protein. Vegetables are the best source of vitamins and minerals. Vegetables also provide the perfect balance of protein. Processed food has little nutritional value, so try to avoid this. ° °Medications ° °Resume taking all of your medications unless your doctor tells you not to. If your incision is causing pain, you may take over-the-counter pain relievers such as acetaminophen (Tylenol) ° °Follow Up ° °Follow up will be arranged at the time of your procedure. You may have an office visit scheduled or may be scheduled for surgery. Ask your surgeon if you have any questions. ° °Please call us immediately for any of the following conditions: °•Severe or worsening pain your legs or feet at rest or with walking. °•Increased pain, redness, drainage at your groin puncture site. °•Fever of 101 degrees or higher. °•If you have any mild or slow bleeding from your puncture site: lie down, apply firm constant pressure over the area with a piece of  gauze or a clean wash cloth for 30 minutes- no peeking!, call 911 right away if you are still bleeding after 30 minutes, or if the bleeding is heavy and unmanageable. ° °Reduce your risk factors of vascular disease: ° °Stop smoking. If you would like help call [facility] at 1-800-QUIT-NOW (1-800-784-8669) or [facility] at 336-586-4000. °Manage your cholesterol °Maintain a desired weight °Control your diabetes °Keep your blood pressure down ° °If you have any questions, please call the office at [phone] ° °

## 2017-08-16 NOTE — Op Note (Signed)
PATIENT: Isabella [name]      MRN: [id number] DOB: 24-Jul-1965    DATE OF PROCEDURE: 08/16/2017  INDICATIONS:    Isabella [name] is a 52 y.o. female with a complicated history.  She underwent a right carotid endarterectomy in 2014 I believe in [place].  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  She required ultimately an aorto to innominate bypass that was done in [facility].  I recently performed a left carotid endarterectomy on the patient.  The patient's been having some dizziness and also occasional weakness in the left arm.  By duplex there was some suggested a stenosis in the innominate artery.  Also there was stenosis in the proximal right common carotid artery.  She presents for arteriography.  PROCEDURE:    1.  Ultrasound-guided access to the right common femoral artery 2.  Arch aortogram 3.  Selective catheterization of the aorto innominate bypass with runoff 4.  Aortogram with bilateral iliac arteriogram and bilateral lower extremity runoff  SURGEON: Isabella [name]. Isabella Bo, MD, FACS  ANESTHESIA: Local  EBL: Minimal  TECHNIQUE: The patient was brought to the peripheral vascular lab.  We did not sedate her.  Both groins were prepped and draped in usual sterile fashion.  Under ultrasound guidance, after the skin was anesthetized, the right common femoral artery was cannulated with a micropuncture needle and a micro puncture sheath introduced over the wire.  This was exchanged for a 5 [place] sheath over a Bentson wire.  A long pigtail catheter was advanced into the ascending aortic arch over the wire.  Selective arch aortogram was obtained.  The origin of the bypass graft took off lower so we advanced the catheter and shot another are charted a 40 degree LAO projection.  Next I selectively catheterized the innominate bypass with an H1 catheter and selective innominate injection was made.  Next this catheter was removed over a wire and exchanged  for the pigtail catheter.  The pigtail catheter was positioned at the L1 vertebral body and flush aortogram obtained.  The catheter was positioned above the aortic bifurcation and an oblique iliac projection was obtained.  Next bilateral lower extremity runoff films were obtained.  The catheter was then removed over a wire.  The patient was transferred to the holding area for removal of the sheath.  No immediate complications were noted.  FINDINGS:   1.  Patent aorto innominate bypass.  There is no stenosis noted within the bypass graft.  I think what was seen on duplex is where the graft is anastomosed to the distal innominate artery where it bifurcates into the subclavian and right common carotid artery. 2.  Tapering with a 80-90%% narrowing in the common carotid artery on the right proximally.  The subclavian artery is patent with no significant stenosis. 3.  The left common carotid artery and carotid bifurcation are patent as is the endarterectomy site.  Both vertebral arteries are patent. 4.  Single renal arteries bilaterally with no significant renal artery stenosis identified.  Mild diffuse disease of the infrarenal aorta.  The common iliac, external iliac, and hypogastric arteries are patent bilaterally. 5.  On the right side the common femoral, deep femoral, superficial femoral, popliteal, anterior tibial, posterior tibial, and peroneal arteries are patent.  There is no significant infrainguinal arterial occlusive disease on the right. 6.  On the left side the common femoral, deep femoral, superficial femoral, popliteal, anterior tibial, posterior tibial, peroneal arteries are patent.  There is no significant infrainguinal arterial occlusive disease on the left.  CLINICAL NOTE: This patient has been having some intermittent weakness in the left arm and has a tight 80-90% right common carotid artery stenosis.  I have reviewed the films with Dr. Fabienne Brunsharles [name] and is felt that she might be a  reasonable candidate for carotid stenting.  I will set up an office visit with him to discuss this.  Isabella Ferrarihristopher Ellieanna Funderburg, MD, FACS [facility]  DATE OF DICTATION:   08/16/2017

## 2017-08-19 ENCOUNTER — Encounter (HOSPITAL_COMMUNITY): Payer: Self-pay | Admitting: Vascular Surgery

## 2017-08-19 ENCOUNTER — Telehealth: Payer: Self-pay | Admitting: Vascular Surgery

## 2017-08-19 MED FILL — Lidocaine HCl Local Inj 1%: INTRAMUSCULAR | Qty: 20 | Status: AC

## 2017-08-19 MED FILL — Heparin Sodium (Porcine) 2 Unit/ML in Sodium Chloride 0.9%: INTRAMUSCULAR | Qty: 1000 | Status: AC

## 2017-08-19 NOTE — Telephone Encounter (Signed)
-----   Message from Sharee PimpleMarilyn K McChesney, RN sent at 08/16/2017 11:51 AM EDT ----- Regarding: asap appt with Dr. Darrick PennaFields to discuss surgery   ----- Message ----- From: Chuck Hintickson, Christopher S, MD Sent: 08/16/2017  11:20 AM To: [facility] Charge Pool Subject: charge and f/u                                  PROCEDURE:   1.  Ultrasound-guided access to the right common femoral artery 2.  Arch aortogram 3.  Selective catheterization of the aorto innominate bypass with runoff 4.  Aortogram with bilateral iliac arteriogram and bilateral lower extremity runoff  SURGEON: Di Kindlehristopher S. Edilia Boickson, MD, FACS  The patient appears to be a potential candidate for a right common carotid artery stent.  I have discussed the case with Dr. Fabienne Brunsharles Fields.  He will she will need an office appointment with him to discuss this option.  Thank you.  This is not urgent.

## 2017-08-19 NOTE — Telephone Encounter (Signed)
Spoke to pt for appt moved to May timeframe of 5/16 Mld lttr

## 2017-09-05 ENCOUNTER — Other Ambulatory Visit: Payer: Self-pay

## 2017-09-05 ENCOUNTER — Encounter: Payer: Self-pay | Admitting: Vascular Surgery

## 2017-09-05 ENCOUNTER — Ambulatory Visit: Payer: Medicare PPO | Admitting: Vascular Surgery

## 2017-09-05 VITALS — BP 122/79 | HR 98 | Temp 97.4°F | Resp 16 | Ht 61.0 in | Wt 140.0 lb

## 2017-09-05 DIAGNOSIS — I6521 Occlusion and stenosis of right carotid artery: Secondary | ICD-10-CM | POA: Diagnosis not present

## 2017-09-05 NOTE — Progress Notes (Addendum)
Patient is a 52 year old female referred by my partner Dr. Edilia Bo for evaluation for possible placement of a right common carotid stent.  The patient has previously had a right carotid endarterectomy elsewhere in [place].  She has also had a previous right innominate stent which failed early.  She subsequently had a right innominate bypass which is currently doing well.  She has had multiple strokes in the past.  My partner Dr. Edilia Bo has recently done a left carotid endarterectomy on her.  She was noted on duplex scan to have narrowing of her right common carotid artery.  Recent arteriogram showed a diffuse tapered narrowing of the proximal to mid right common carotid artery approximately 80% stenosis.  The patient denies any classic TIA type symptoms.  She does get some dizziness if she tilts her head back.  She also gets some weakness in her legs when climbing stairs.  She denies intermittent left upper extremity or left lower extremity weakness.  She has had no slurred speech.  She currently is smoking about a pack of cigarettes per day.  States that she had some sort of evaluation done in [place] that showed that she was more at risk for forming intimal hyperplasia and stents.  She does not really remember exactly what type of genetic testing she may have had.  She does not really remember any specific diagnosis.  Her entire family has a pattern of early atherosclerosis.  She currently is on aspirin and Plavix.  She is intolerant to statins with muscle aches.  Past Medical History:  Diagnosis Date  . Arthritis    "lower back, knees, elbows"  . Complication of anesthesia 2013   Patient stated that she could not  "wake up from anesthesia and coded" during aorta surgery in [facility]  . COPD (chronic obstructive pulmonary disease) (HCC)    patient states she has been diagnosed with COPD "but does not use any inhalers"  . Diabetes mellitus without complication (HCC)   . GERD  (gastroesophageal reflux disease)    diet controlled  . History of hiatal hernia   . Peripheral vascular disease (HCC)   . Pneumonia   . Poison sumac    acquired it approx on Friday.  On back, below left shoulder and under breasts  . Stroke [facility])     Past Surgical History:  Procedure Laterality Date  . ABDOMINAL AORTOGRAM W/LOWER EXTREMITY N/A 08/16/2017   Procedure: ABDOMINAL AORTOGRAM W/LOWER EXTREMITY;  Surgeon: Chuck Hint, MD;  Location: [facility] INVASIVE CV LAB;  Service: Cardiovascular;  Laterality: N/A;  . ABDOMINAL HYSTERECTOMY    . AORTA SURGERY     with sent placement  . AORTIC ARCH ANGIOGRAPHY  08/16/2017   Procedure: AORTIC ARCH ANGIOGRAPHY/ INNOMINTE BYPASS ANGIOGRAPHY;  Surgeon: Chuck Hint, MD;  Location[facility] INVASIVE CV LAB;  Service: Cardiovascular;;  . CAROTID ENDARTERECTOMY Right   . CYSTOCELE REPAIR    . ENDARTERECTOMY Left 06/25/2017   Procedure: LEFT CAROTID ENDARTERECTOMY;  Surgeon: Chuck Hint, MD;  Location: [facility] OR;  Service: Vascular;  Laterality: Left;  . PATCH ANGIOPLASTY Left 06/25/2017   Procedure: PATCH ANGIOPLASTY OF LEFT CAROTID ARTERY USING HEMASHIELD PALTINUM FINESSE PATCH;  Surgeon: Chuck Hint, MD;  Location: [facility] OR;  Service: Vascular;  Laterality: Left;  . RECTOCELE REPAIR     x2     Current Outpatient Medications on File Prior to Visit  Medication Sig Dispense Refill  . aspirin 81 MG chewable tablet Chew 81 mg by  mouth daily.    . clopidogrel (PLAVIX) 75 MG tablet Take 1 tablet (75 mg total) by mouth daily. 30 tablet 2  . glipiZIDE (GLUCOTROL) 5 MG tablet Take 10 mg by mouth 2 (two) times daily.      No current facility-administered medications on file prior to visit.    Review of systems: She denies shortness of breath.  She denies chest pain.  Physical exam:  Vitals:   09/05/17 1147  BP: 122/79  Pulse: 98  Resp: 16  Temp: (!) 97.4 F (36.3 C)  TempSrc: Oral  SpO2: 96%  Weight: 140 lb (63.5 kg)   Height: 5\' 1"  (1.549 m)    Neck: Right carotid bruit no left carotid bruit  Chest clear to auscultation bilaterally  Cardiac: Regular rate and rhythm without murmur  Neuro: Symmetric upper extremity lower extremity motor strength 5/5 no facial asymmetry  Data: I reviewed the patient's recent arteriogram as well as her CT angiogram.  This shows a diffuse tapering of the proximal right common carotid artery approximately 80%.  This extends from the origin of the artery to the mid section.  Innominate artery bypass is patent.  The left common carotid and left carotid bifurcation is widely patent.  This was confirmed on the CT angios.  She also had a recent carotid duplex exam which confirmed the above findings.  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  I did discuss with her today the potential of stroke if she occludes the right common carotid artery.  However also discussed with her that no one really is completely sure of the natural history of these types of lesions.  I did discuss with her that I think it would be lower risk to proceed with a right common carotid stent rather than an open operation requiring possibly redo sternotomy and redo right neck operation.  She was a little bit reluctant to proceed with a stent because she is convinced that the durability may not be good.  Plan: Patient wishes to defer any treatment for now.  In the meanwhile we will try to get the records [facility] [place] regarding what genetic testing she had as well as the cause of failure of her previous stent.  If she decides to proceed with potentially right common carotid stenting she can come back sooner and see us.  Otherwise we will see her back in 6 months time with a repeat CT angios head and neck.  We will continue her Plavix and aspirin.   She will try to quit smoking.  Fabienne Brunsharles Dayannara Pascal, MD [facility] [place] Office: [phone] Pager: [phone]  I received additional medical records from [place] [facility] and I reviewed these on Sep 12, 2017.  Were primarily operative notes related to her aorto innominate bypass.  There were some laboratory tests which did not show any positive findings on a hypercoagulable profile.  There was no new mention in the notes that I received regarding any sort of stent allergy.  Fabienne Brunsharles Kayla Deshaies, MD [facility] [place] Office: [phone] Pager: [phone]

## 2017-09-10 ENCOUNTER — Other Ambulatory Visit: Payer: Self-pay

## 2017-09-26 ENCOUNTER — Ambulatory Visit: Payer: Medicare PPO | Admitting: Vascular Surgery

## 2017-10-17 ENCOUNTER — Ambulatory Visit: Payer: Medicare PPO | Admitting: Vascular Surgery

## 2017-10-17 DIAGNOSIS — K219 Gastro-esophageal reflux disease without esophagitis: Secondary | ICD-10-CM | POA: Insufficient documentation

## 2017-10-17 DIAGNOSIS — Z5320 Procedure and treatment not carried out because of patient's decision for unspecified reasons: Secondary | ICD-10-CM | POA: Insufficient documentation

## 2017-10-25 ENCOUNTER — Other Ambulatory Visit: Payer: Self-pay | Admitting: Vascular Surgery

## 2017-10-25 DIAGNOSIS — I6521 Occlusion and stenosis of right carotid artery: Secondary | ICD-10-CM

## 2017-10-25 DIAGNOSIS — I771 Stricture of artery: Secondary | ICD-10-CM

## 2017-11-20 ENCOUNTER — Other Ambulatory Visit: Payer: Self-pay

## 2017-11-20 DIAGNOSIS — R200 Anesthesia of skin: Secondary | ICD-10-CM

## 2017-11-20 DIAGNOSIS — I6521 Occlusion and stenosis of right carotid artery: Secondary | ICD-10-CM

## 2017-11-21 ENCOUNTER — Ambulatory Visit (HOSPITAL_COMMUNITY)
Admission: RE | Admit: 2017-11-21 | Discharge: 2017-11-21 | Disposition: A | Discharge status: Home / Self Care | Payer: Medicare PPO | Source: Ambulatory Visit | Attending: Family | Admitting: Family

## 2017-11-21 DIAGNOSIS — I6523 Occlusion and stenosis of bilateral carotid arteries: Secondary | ICD-10-CM | POA: Insufficient documentation

## 2017-11-21 DIAGNOSIS — R200 Anesthesia of skin: Secondary | ICD-10-CM | POA: Diagnosis not present

## 2017-11-21 DIAGNOSIS — I6521 Occlusion and stenosis of right carotid artery: Secondary | ICD-10-CM

## 2017-11-28 ENCOUNTER — Ambulatory Visit (INDEPENDENT_AMBULATORY_CARE_PROVIDER_SITE_OTHER): Payer: Medicare PPO | Admitting: Vascular Surgery

## 2017-11-28 ENCOUNTER — Encounter: Payer: Self-pay | Admitting: Vascular Surgery

## 2017-11-28 ENCOUNTER — Other Ambulatory Visit: Payer: Self-pay

## 2017-11-28 VITALS — BP 127/77 | HR 86 | Temp 98.3°F | Resp 18 | Ht 62.0 in | Wt 143.2 lb

## 2017-11-28 DIAGNOSIS — I6521 Occlusion and stenosis of right carotid artery: Secondary | ICD-10-CM

## 2017-11-28 MED ORDER — VARENICLINE TARTRATE 0.5 MG PO TABS: 0.5000 mg | ORAL_TABLET | Freq: Two times a day (BID) | ORAL | 6 refills | Status: DC

## 2017-11-28 MED ORDER — VARENICLINE TARTRATE 0.5 MG PO TABS: 0.5000 mg | ORAL_TABLET | Freq: Two times a day (BID) | ORAL | 1 refills | Status: DC

## 2017-11-28 NOTE — Progress Notes (Signed)
Patient is a 51-year-old female who returns for follow-up today.  She has previously had a innominate artery bypass in [place].  She recently had a left carotid endarterectomy done by my partner Dr. Dickson.  She has also previously had a right carotid endarterectomy.  She was noted on duplex scan and on recent arteriogram to have stenosis of the proximal right common carotid artery.  She was offered stenting of this in April of this year but decided for conservative management.  She now returns more anxious about the narrowing in her right carotid artery and complains about right arm numbness.  She occasionally gets some dizziness if she tilts her head back.  She does not describe any right cortical symptoms such as weakness numbness or facial droop.  She is intolerant to statins due to muscle aches.  She is currently on aspirin and Plavix.  She has a family history of early atherosclerosis and multiple family members.  Past Medical History:  Diagnosis Date  . Arthritis    "lower back, knees, elbows"  . Complication of anesthesia 2013   Patient stated that she could not  "wake up from anesthesia and coded" during aorta surgery in [place]  . COPD (chronic obstructive pulmonary disease) (HCC)    patient states she has been diagnosed with COPD "but does not use any inhalers"  . Diabetes mellitus without complication (HCC)   . GERD (gastroesophageal reflux disease)    diet controlled  . History of hiatal hernia   . Peripheral vascular disease (HCC)   . Pneumonia   . Poison sumac    acquired it approx on Friday.  On back, below left shoulder and under breasts  . Stroke (HCC)     Past Surgical History:  Procedure Laterality Date  . ABDOMINAL AORTOGRAM W/LOWER EXTREMITY N/A 08/16/2017   Procedure: ABDOMINAL AORTOGRAM W/LOWER EXTREMITY;  Surgeon: Dickson, Christopher S, MD;  Location: [facility] INVASIVE CV LAB;  Service: Cardiovascular;  Laterality: N/A;  . ABDOMINAL HYSTERECTOMY    . AORTA SURGERY      with sent placement  . AORTIC ARCH ANGIOGRAPHY  08/16/2017   Procedure: AORTIC ARCH ANGIOGRAPHY/ INNOMINTE BYPASS ANGIOGRAPHY;  Surgeon: Dickson, Christopher S, MD;  Location: [facility] INVASIVE CV LAB;  Service: Cardiovascular;;  . CAROTID ENDARTERECTOMY Right   . CYSTOCELE REPAIR    . ENDARTERECTOMY Left 06/25/2017   Procedure: LEFT CAROTID ENDARTERECTOMY;  Surgeon: Dickson, Christopher S, MD;  Location: [facility] OR;  Service: Vascular;  Laterality: Left;  . PATCH ANGIOPLASTY Left 06/25/2017   Procedure: PATCH ANGIOPLASTY OF LEFT CAROTID ARTERY USING HEMASHIELD PALTINUM FINESSE PATCH;  Surgeon: Dickson, Christopher S, MD;  Location: [facility] OR;  Service: Vascular;  Laterality: Left;  . RECTOCELE REPAIR     x2    Current Outpatient Medications on File Prior to Visit  Medication Sig Dispense Refill  . aspirin 81 MG chewable tablet Chew 81 mg by mouth daily.    . clopidogrel (PLAVIX) 75 MG tablet Take 1 tablet (75 mg total) by mouth daily. 30 tablet 2  . Insulin Glargine (LANTUS SOLOSTAR) 100 UNIT/ML Solostar Pen Inject 32 Units into the skin at bedtime.    . glipiZIDE (GLUCOTROL) 5 MG tablet Take 10 mg by mouth 2 (two) times daily.      No current facility-administered medications on file prior to visit.     Family History  Problem Relation Age of Onset  . AAA (abdominal aortic aneurysm) Sister   . Peripheral Artery Disease Brother       Review of systems: She has shortness of breath with exertion.  She denies chest pain.  Physical exam:  Vitals:   11/28/17 1243 11/28/17 1246  BP: (!) 162/100 127/77  Pulse: 86   Resp: 18   Temp: 98.3 F (36.8 C)   TempSrc: Oral   SpO2: 93%   Weight: 143 lb 3.2 oz (65 kg)   Height: 5' 2" (1.575 m)     Neck: No carotid bruit  Chest: Clear to auscultation bilaterally  Cardiac: Regular rate and rhythm  Abdomen: Soft nontender  Extremities: 2+ femoral dorsalis pedis pulses bilaterally  Neuro: Symmetric face no droop upper extremity lower extremity  motor strength 5/5 symmetric tongue midline no obvious swallowing difficulties  Data: I reviewed the patient's recent arteriogram which shows a diffuse narrowing of the proximal right common carotid artery.  She also had a recent duplex scan which suggested moderate carotid stenosis at the bifurcation bilaterally.  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  I discussed with the patient that I do not believe her right arm numbness is related to this but she certainly is at high risk to occlude the right carotid artery secondary to the narrowing.  Plan: Patient is scheduled for retrograde common carotid artery stenting August.  She was given a prescription today for Chantix with 6 refills to try to quit smoking.  Risk benefits possible complications of procedure details of carotid stenting were discussed with the patient today including but not limited to bleeding infection stroke risk of 1 to 2% cranial nerve injury risk of 5 to 10%.  She understands and agrees to proceed.  Bertin Inabinet, MD [facility] [place] Office: [phone] Pager: [phone]  

## 2017-11-28 NOTE — H&P (View-Only) (Signed)
Patient is a 52 year old female who returns for follow-up today.  She has previously had a innominate artery bypass in [place].  She recently had a left carotid endarterectomy done by my partner Dr. Edilia Boickson.  She has also previously had a right carotid endarterectomy.  She was noted on duplex scan and on recent arteriogram to have stenosis of the proximal right common carotid artery.  She was offered stenting of this in April of this year but decided for conservative management.  She now returns more anxious about the narrowing in her right carotid artery and complains about right arm numbness.  She occasionally gets some dizziness if she tilts her head back.  She does not describe any right cortical symptoms such as weakness numbness or facial droop.  She is intolerant to statins due to muscle aches.  She is currently on aspirin and Plavix.  She has a family history of early atherosclerosis and multiple family members.  Past Medical History:  Diagnosis Date  . Arthritis    "lower back, knees, elbows"  . Complication of anesthesia 2013   Patient stated that she could not  "wake up from anesthesia and coded" during [facility] in [facility] [place]  . COPD (chronic obstructive pulmonary disease) (HCC)    patient states she has been diagnosed with COPD "but does not use any inhalers"  . Diabetes mellitus without complication (HCC)   . GERD (gastroesophageal reflux disease)    diet controlled  . History of hiatal hernia   . Peripheral vascular disease (HCC)   . Pneumonia   . Poison sumac    acquired it approx on Friday.  On back, below left shoulder and under breasts  . Stroke [facility](HCC)     Past Surgical History:  Procedure Laterality Date  . ABDOMINAL AORTOGRAM W/LOWER EXTREMITY N/A 08/16/2017   Procedure: ABDOMINAL AORTOGRAM W/LOWER EXTREMITY;  Surgeon: Chuck Hintickson, Christopher S, MD;  [facility];  Service: Cardiovascular;  Laterality: N/A;  . ABDOMINAL HYSTERECTOMY    . AORTA SURGERY      with sent placement  . AORTIC ARCH ANGIOGRAPHY  08/16/2017   Procedure: AORTIC ARCH ANGIOGRAPHY/ INNOMINTE BYPASS ANGIOGRAPHY;  Surgeon: Chuck Hintickson, Christopher S, MD;  [facility];  Service: Cardiovascular;;  . CAROTID ENDARTERECTOMY Right   . CYSTOCELE REPAIR    . ENDARTERECTOMY Left 06/25/2017   Procedure: LEFT CAROTID ENDARTERECTOMY;  Surgeon: Chuck Hintickson, Christopher S, MD;  [facility];  Service: Vascular;  Laterality: Left;  . PATCH ANGIOPLASTY Left 06/25/2017   Procedure: PATCH ANGIOPLASTY OF LEFT CAROTID ARTERY USING HEMASHIELD PALTINUM FINESSE PATCH;  Surgeon: Chuck Hintickson, Christopher S, MD;  [facility];  Service: Vascular;  Laterality: Left;  . RECTOCELE REPAIR     x2    Current Outpatient Medications on File Prior to Visit  Medication Sig Dispense Refill  . aspirin 81 MG chewable tablet Chew 81 mg by mouth daily.    . clopidogrel (PLAVIX) 75 MG tablet Take 1 tablet (75 mg total) by mouth daily. 30 tablet 2  . Insulin Glargine (LANTUS SOLOSTAR) 100 UNIT/ML Solostar Pen Inject 32 Units into the skin at bedtime.    Marland Kitchen. glipiZIDE (GLUCOTROL) 5 MG tablet Take 10 mg by mouth 2 (two) times daily.      No current facility-administered medications on file prior to visit.     Family History  Problem Relation Age of Onset  . AAA (abdominal aortic aneurysm) Sister   . Peripheral Artery Disease Brother  Review of systems: She has shortness of breath with exertion.  She denies chest pain.  Physical exam:  Vitals:   11/28/17 1243 11/28/17 1246  BP: (!) 162/100 127/77  Pulse: 86   Resp: 18   Temp: 98.3 F (36.8 C)   TempSrc: Oral   SpO2: 93%   Weight: 143 lb 3.2 oz (65 kg)   Height: 5\' 2"  (1.575 m)     Neck: No carotid bruit  Chest: Clear to auscultation bilaterally  Cardiac: Regular rate and rhythm  Abdomen: Soft nontender  Extremities: 2+ femoral dorsalis pedis pulses bilaterally  Neuro: Symmetric face no droop upper extremity lower extremity  motor strength 5/5 symmetric tongue midline no obvious swallowing difficulties  Data: I reviewed the patient's recent arteriogram which shows a diffuse narrowing of the proximal right common carotid artery.  She also had a recent duplex scan which suggested moderate carotid stenosis at the bifurcation bilaterally.  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  I discussed with the patient that I do not believe her right arm numbness is related to this but she certainly is at high risk to occlude the right carotid artery secondary to the narrowing.  Plan: Patient is scheduled for retrograde common carotid artery stenting August.  She was given a prescription today for Chantix with 6 refills to try to quit smoking.  Risk benefits possible complications of procedure details of carotid stenting were discussed with the patient today including but not limited to bleeding infection stroke risk of 1 to 2% cranial nerve injury risk of 5 to 10%.  She understands and agrees to proceed.  Fabienne Bruns, MD [facility] [place] Office: [phone] Pager: [phone]

## 2017-11-29 ENCOUNTER — Other Ambulatory Visit: Payer: Self-pay | Admitting: *Deleted

## 2017-12-10 NOTE — Pre-Procedure Instructions (Signed)
Isabella MoutonLisa Rae [name]  12/10/2017      [facility] - [place], [place] - [address] [place] [id number] Phone: [phone] Fax: [phone]    Your procedure is scheduled on Wed., Aug. 7, 2019 from 10:30AM-12:41PM  [facility] "A" at 8:30PM  Call this number if you have problems the morning of surgery:  [phone]   Remember:  Do not eat or drink after midnight on Aug. 6th    Take these medicines the morning of surgery with A SIP OF WATER: If needed Albuterol Inhaler (Bring with you the day of surgery)  Follow your surgeon's instructions on when to stop Aspirin and Plavix.  If no instructions were given by your surgeon then you will need to call the office to get those instructions.    As of today, stop taking all Other Aspirin Products, Vitamins, Fish oils, and Herbal medications. Also stop all NSAIDS i.e. Advil, Ibuprofen, Motrin, Aleve, Anaprox, Naproxen, BC, Goody Powders, and all Supplements.  How to Manage Your Diabetes Before and After Surgery  Why is it important to control my blood sugar before and after surgery? . Improving blood sugar levels before and after surgery helps healing and can limit problems. . A way of improving blood sugar control is eating a healthy diet by: o  Eating less sugar and carbohydrates o  Increasing activity/exercise o  Talking with your doctor about reaching your blood sugar goals . High blood sugars (greater than 180 mg/dL) can raise your risk of infections and slow your recovery, so you will need to focus on controlling your diabetes during the weeks before surgery. . Make sure that the doctor who takes care of your diabetes knows about your planned surgery including the date and location.  How do I manage my blood sugar before surgery? . Check your blood sugar at least 4 times a day, starting 2 days before surgery, to make sure that the level is not too  high or low. o Check your blood sugar the morning of your surgery when you wake up and every 2 hours until you get to the Short Stay unit. . If your blood sugar is less than 70 mg/dL, you will need to treat for low blood sugar: o Do not take insulin. o Treat a low blood sugar (less than 70 mg/dL) with  cup of clear juice (cranberry or apple), 4 glucose tablets, OR glucose gel. Recheck blood sugar in 15 minutes after treatment (to make sure it is greater than 70 mg/dL). If your blood sugar is not greater than 70 mg/dL on recheck, call [phone] o  for further instructions. . Report your blood sugar to the short stay nurse when you get to Short Stay. .  If your CBG is greater than 220 mg/dL, call us at the number above for further instructions.  . If you are admitted to the hospital after surgery: o Your blood sugar will be checked by the staff and you will probably be given insulin after surgery (instead of oral diabetes medicines) to make sure you have good blood sugar levels. o The goal for blood sugar control after surgery is 80-180 mg/dL.  WHAT DO I DO ABOUT MY DIABETES MEDICATION?  . THE NIGHT BEFORE SURGERY, take _____16______ units of _____Lantus______insulin    Reviewed and Endorsed by [facility] Patient Education Committee, August 2015    Do not wear jewelry, make-up or nail polish.  Do not  wear lotions, powders, or perfumes, or deodorant.  Do not shave 48 hours prior to surgery.   Do not bring valuables to the hospital.  [facility] is not responsible for any belongings or valuables.  Contacts, dentures or bridgework may not be worn into surgery.  Leave your suitcase in the car.  After surgery it may be brought to your room.  For patients admitted to the hospital, discharge time will be determined by your treatment team.  Patients discharged the day of surgery will not be allowed to drive home.   Special instructions:   [place]- Preparing For Surgery  Before  surgery, you can play an important role. Because skin is not sterile, your skin needs to be as free of germs as possible. You can reduce the number of germs on your skin by washing with CHG (chlorahexidine gluconate) Soap before surgery.  CHG is an antiseptic cleaner which kills germs and bonds with the skin to continue killing germs even after washing.    Oral Hygiene is also important to reduce your risk of infection.  Remember - BRUSH YOUR TEETH THE MORNING OF SURGERY WITH YOUR REGULAR TOOTHPASTE  Please do not use if you have an allergy to CHG or antibacterial soaps. If your skin becomes reddened/irritated stop using the CHG.  Do not shave (including legs and underarms) for at least 48 hours prior to first CHG shower. It is OK to shave your face.  Please follow these instructions carefully.   1. Shower the NIGHT BEFORE SURGERY and the MORNING OF SURGERY with CHG.   2. If you chose to wash your hair, wash your hair first as usual with your normal shampoo.  3. After you shampoo, rinse your hair and body thoroughly to remove the shampoo.  4. Use CHG as you would any other liquid soap. You can apply CHG directly to the skin and wash gently with a scrungie or a clean washcloth.   5. Apply the CHG Soap to your body ONLY FROM THE NECK DOWN.  Do not use on open wounds or open sores. Avoid contact with your eyes, ears, mouth and genitals (private parts). Wash Face and genitals (private parts)  with your normal soap.  6. Wash thoroughly, paying special attention to the area where your surgery will be performed.  7. Thoroughly rinse your body with warm water from the neck down.  8. DO NOT shower/wash with your normal soap after using and rinsing off the CHG Soap.  9. Pat yourself dry with a CLEAN TOWEL.  10. Wear CLEAN PAJAMAS to bed the night before surgery, wear comfortable clothes the morning of surgery  11. Place CLEAN SHEETS on your bed the night of your first shower and DO NOT SLEEP WITH  PETS.  Day of Surgery:  Do not apply any deodorants/lotions.  Please wear clean clothes to the hospital/surgery center.   Remember to brush your teeth WITH YOUR REGULAR TOOTHPASTE.  Please read over the following fact sheets that you were given. Pain Booklet, Coughing and Deep Breathing, MRSA Information and Surgical Site Infection Prevention

## 2017-12-11 ENCOUNTER — Encounter (HOSPITAL_COMMUNITY)
Admission: RE | Admit: 2017-12-11 | Discharge: 2017-12-11 | Disposition: A | Discharge status: Home / Self Care | Payer: Medicare PPO | Source: Ambulatory Visit | Attending: Vascular Surgery | Admitting: Vascular Surgery

## 2017-12-11 ENCOUNTER — Encounter (HOSPITAL_COMMUNITY): Payer: Self-pay

## 2017-12-11 ENCOUNTER — Other Ambulatory Visit: Payer: Self-pay

## 2017-12-11 DIAGNOSIS — Z9889 Other specified postprocedural states: Secondary | ICD-10-CM | POA: Insufficient documentation

## 2017-12-11 DIAGNOSIS — I739 Peripheral vascular disease, unspecified: Secondary | ICD-10-CM | POA: Diagnosis not present

## 2017-12-11 DIAGNOSIS — E119 Type 2 diabetes mellitus without complications: Secondary | ICD-10-CM | POA: Insufficient documentation

## 2017-12-11 DIAGNOSIS — I6521 Occlusion and stenosis of right carotid artery: Secondary | ICD-10-CM | POA: Diagnosis not present

## 2017-12-11 DIAGNOSIS — Z79899 Other long term (current) drug therapy: Secondary | ICD-10-CM | POA: Insufficient documentation

## 2017-12-11 DIAGNOSIS — Z794 Long term (current) use of insulin: Secondary | ICD-10-CM | POA: Insufficient documentation

## 2017-12-11 DIAGNOSIS — Z792 Long term (current) use of antibiotics: Secondary | ICD-10-CM | POA: Insufficient documentation

## 2017-12-11 DIAGNOSIS — Z01812 Encounter for preprocedural laboratory examination: Secondary | ICD-10-CM | POA: Diagnosis not present

## 2017-12-11 DIAGNOSIS — Z7982 Long term (current) use of aspirin: Secondary | ICD-10-CM | POA: Diagnosis not present

## 2017-12-11 DIAGNOSIS — J449 Chronic obstructive pulmonary disease, unspecified: Secondary | ICD-10-CM | POA: Diagnosis not present

## 2017-12-11 HISTORY — DX: Fatty (change of) liver, not elsewhere classified: K76.0

## 2017-12-11 HISTORY — DX: Anemia, unspecified: D64.9

## 2017-12-11 HISTORY — DX: Atherosclerotic heart disease of native coronary artery without angina pectoris: I25.10

## 2017-12-11 HISTORY — DX: Personal history of urinary calculi: Z87.442

## 2017-12-11 LAB — HEMOGLOBIN A1C
HEMOGLOBIN A1C: 8.1 % — AB (ref 4.8–5.6)
Mean Plasma Glucose: 185.77 mg/dL

## 2017-12-11 LAB — SURGICAL PCR SCREEN
MRSA, PCR: NEGATIVE
STAPHYLOCOCCUS AUREUS: NEGATIVE

## 2017-12-11 LAB — URINALYSIS, ROUTINE W REFLEX MICROSCOPIC
BILIRUBIN URINE: NEGATIVE
Glucose, UA: 50 mg/dL — AB
Hgb urine dipstick: NEGATIVE
Ketones, ur: NEGATIVE mg/dL
LEUKOCYTES UA: NEGATIVE
NITRITE: NEGATIVE
PH: 5 (ref 5.0–8.0)
Protein, ur: NEGATIVE mg/dL
SPECIFIC GRAVITY, URINE: 1.005 (ref 1.005–1.030)

## 2017-12-11 LAB — COMPREHENSIVE METABOLIC PANEL
ALBUMIN: 3.8 g/dL (ref 3.5–5.0)
ALT: 14 U/L (ref 0–44)
AST: 15 U/L (ref 15–41)
Alkaline Phosphatase: 68 U/L (ref 38–126)
Anion gap: 7 (ref 5–15)
BUN: 11 mg/dL (ref 6–20)
CHLORIDE: 106 mmol/L (ref 98–111)
CO2: 25 mmol/L (ref 22–32)
Calcium: 9.1 mg/dL (ref 8.9–10.3)
Creatinine, Ser: 0.67 mg/dL (ref 0.44–1.00)
GFR calc Af Amer: >60 mL/min (ref >60)
GFR calc non Af Amer: >60 mL/min (ref >60)
GLUCOSE: 185 mg/dL — AB (ref 70–99)
POTASSIUM: 4 mmol/L (ref 3.5–5.1)
Sodium: 138 mmol/L (ref 135–145)
TOTAL PROTEIN: 7.3 g/dL (ref 6.5–8.1)
Total Bilirubin: 0.7 mg/dL (ref 0.3–1.2)

## 2017-12-11 LAB — CBC
HEMATOCRIT: 46 % (ref 36.0–46.0)
Hemoglobin: 14.9 g/dL (ref 12.0–15.0)
MCH: 29.9 pg (ref 26.0–34.0)
MCHC: 32.4 g/dL (ref 30.0–36.0)
MCV: 92.4 fL (ref 78.0–100.0)
Platelets: 310 10*3/uL (ref 150–400)
RBC: 4.98 MIL/uL (ref 3.87–5.11)
RDW: 13.2 % (ref 11.5–15.5)
WBC: 10.1 10*3/uL (ref 4.0–10.5)

## 2017-12-11 LAB — GLUCOSE, CAPILLARY: Glucose-Capillary: 199 mg/dL — ABNORMAL HIGH (ref 70–99)

## 2017-12-11 LAB — TYPE AND SCREEN
ABO/RH(D): O POS
ANTIBODY SCREEN: NEGATIVE

## 2017-12-11 LAB — APTT: APTT: 28 s (ref 24–36)

## 2017-12-11 LAB — PROTIME-INR
INR: 1
PROTHROMBIN TIME: 13.1 s (ref 11.4–15.2)

## 2017-12-11 NOTE — Progress Notes (Signed)
[facility]  Cardiologist - Dr. Darlyn Chambereepak Banerjee  Chest x-ray - Denies  EKG - 08/16/17 (E)  Stress Test - 06/24/17- Media  ECHO - Denies  Cardiac Cath - 2014  Sleep Study - Denies CPAP - None  LABS- 12/11/17: CBC, CMP, PT, PTT, T/S, UA, PCR  ASA- Continue Plavix- Continue  HA1C- 12/11/17 Fasting Blood Sugar - 117-165, Today 199 Checks Blood Sugar ___2__ times a day  Anesthesia- Yes- Cardiac history/previous note 06/2017  Pt denies having chest pain, sob, or fever at this time. All instructions explained to the pt, with a verbal understanding of the material. Pt agrees to go over the instructions while at home for a better understanding. The opportunity to ask questions was provided.

## 2017-12-12 ENCOUNTER — Encounter (HOSPITAL_COMMUNITY): Payer: Self-pay

## 2017-12-12 NOTE — Progress Notes (Signed)
Anesthesia Chart Review:  Case:  [id number] Date/Time:  12/18/17 1015   Procedure:  INSERTION OF RETROGRADE CAROTID STENT (Right )   Anesthesia type:  General   Pre-op diagnosis:  right carotid stenosis   Location:  [facility] ROOM 71 / [facility] OR   Surgeon:  Elam Dutch, MD      DISCUSSION: Patient is a 52 year old female scheduled for the above procedure. She is s/p left carotid endarterectomy on 06/25/17 and is s/p innominate artery bypass 2013 and right carotid endarterectomy ~ 2014. Recent carotid Duplex and arteriogram showed stenosis of the proximal right CCA, but patient initially opted for conservative management. Now she is having right arm numbness and dizziness with neck extension. Although her right CCA stenosis was felt not likely the cause of her symptoms, the above procedure again recommended given she is at increased risk of occluding her right carotid artery.   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 Patience Musca, [facility]) complicated by what sounds like aspiration with respiratory code and emergency intubation. According to [facility] notes from 2013, "Protein C and Factor V are negative.  Only slight elevation of ESR, ANA and rheumatoid factor negative." (In 06/2017, I was never able to obtain 2013 anesthesia records or discharge summary from [facility].)  Since her last surgery, her PCP has started her on insulin for better DM control. Her A1c is 8.1 (down from 9.2). She is to continue ASA and Plavix for procedure. If no acute changes then I anticipate that she can proceed as planned.   VS: BP 119/84   Pulse 92   Temp 36.6 C   Resp 18   Ht '5\' 2"'  (1.575 m)   Wt 140 lb 8 oz (63.7 kg)   SpO2 98%   BMI 25.70  kg/m   PROVIDERS: - PCP is Dr. Brooke Bonito with [facility] ([place]). Last visit 11/12/17. - Cardiologist is Dr. Laymond Purser with [facility] in [place]. Last office note currently available was from 12/31/16 for preoperative evaluation prior to left CEA. She had a stress and echo (see below).   LABS: Labs reviewed: Acceptable for surgery. (all labs ordered are listed, but only abnormal results are displayed)  Labs Reviewed  GLUCOSE, CAPILLARY - Abnormal; Notable for the following components:      Result Value   Glucose-Capillary 199 (*)    All other components within normal limits  COMPREHENSIVE METABOLIC PANEL - Abnormal; Notable for the following components:   Glucose, Bld 185 (*)    All other components within normal limits  URINALYSIS, ROUTINE W REFLEX MICROSCOPIC - Abnormal; Notable for the following components:   Glucose, UA 50 (*)    All other components within normal limits  HEMOGLOBIN A1C - Abnormal; Notable for the following components:   Hgb A1c MFr Bld 8.1 (*)    All other components within normal limits  SURGICAL PCR SCREEN  APTT  CBC  PROTIME-INR  TYPE AND SCREEN    EKG: 08/16/17: NSR, non-specific T wave abnormality. Prolonged QT (QT 416 ms, QTc 468 ms).   CV: Carotid U/S 11/21/17: Final Interpretation: Right Carotid: Patent right carotid endarterectomy site with increased velocity        throughout the internal carotid artery with  velocity in the        proximal internal carotid artery in the 40 - 59% stenosis range.        Significant increase of velocity in the proximal CCA suggestive        of >50% stenosis range, Left Carotid: Patent left carotid endarterectomy site with the velocity in the       proximal ICA in the 1 - 39% range. Increased velcocity in the mid       to distal internal carotid artery. Technically difficult exam due to coughing and family  interupption.  Abdominal aortogram/aortic arch angiography 08/16/17 Scot Dock, Harrell Gave, MD): 1.  Patent aorto innominate bypass.  There is no stenosis noted within the bypass graft.  I think what was seen on duplex is where the graft is anastomosed to the distal innominate artery where it bifurcates into the subclavian and right common carotid artery. 2.  Tapering with a 80-90%% narrowing in the common carotid artery on the right proximally.  The subclavian artery is patent with no significant stenosis. 3.  The left common carotid artery and carotid bifurcation are patent as is the endarterectomy site.  Both vertebral arteries are patent. 4.  Single renal arteries bilaterally with no significant renal artery stenosis identified.  Mild diffuse disease of the infrarenal aorta.  The common iliac, external iliac, and hypogastric arteries are patent bilaterally. 5.  On the right side the common femoral, deep femoral, superficial femoral, popliteal, anterior tibial, posterior tibial, and peroneal arteries are patent.  There is no significant infrainguinal arterial occlusive disease on the right. 6.  On the left side the common femoral, deep femoral, superficial femoral, popliteal, anterior tibial, posterior tibial, peroneal arteries are patent.  There is no significant infrainguinal arterial occlusive disease on the left. CLINICAL NOTE: This patient has been having some intermittent weakness in the left arm and has a tight 80-90% right common carotid artery stenosis.  I have reviewed the films with Dr. Ruta Hinds and is felt that she might be a reasonable candidate for carotid stenting.  I will set up an office visit with him to discuss this.  ETT 01/11/17 ([facility]; scanned under Media tab, Correspondence 06/25/17):  Interpretation: Summary: Resting ECG: SR, RAE. Functional capacity: Normal. HR response to exercise appropriate. BP Response to exercise: resting hypertension - exaggerated response.  Chest pain: None. Arrhythmias: ventricular premature beats - pairs, isolated; ventricular premature beats - isolated. ST Changes: Depression upsloping. Probable normal stress test. S-T changes suggestive of hypertension. Good exercise tolerance without symptoms. Overall Impression: Normal Stress Test.  Echo 01/09/17 ([facility]; scanned under Media tab, Correspondence 06/25/17):  Conclusions: 1.  Mild to moderate concentric LVH with normal cavity size and systolic function.   2.  Normal right ventricular dimensions and function. 3.  No major valvular abnormalities noted. (Trace to mild mitral regurgitation.  Trace tricuspid regurgitation.              Trace pulmonic regurgitation.) 4.  Diastolic dysfunction.   Past Medical History:  Diagnosis Date  . Anemia   . Arthritis    "lower back, knees, elbows"  . Complication of anesthesia 2013   Patient stated that she could not  "wake up from anesthesia and coded" during aorta surgery in [facility]; by account likely post-op aspiration with respiratory distress/reintubation 11/2011  . COPD (chronic obstructive pulmonary disease) ([place])    patient states she has been diagnosed with COPD "but does not use any inhalers"  . Coronary artery  disease   . Diabetes mellitus without complication ([place])    Type II  . Fatty liver   . GERD (gastroesophageal reflux disease)    diet controlled  . History of hiatal hernia   . History of kidney stones   . Peripheral vascular disease ([place])   . Pneumonia   . Poison sumac    acquired it approx on Friday.  On back, below left shoulder and under breasts  . Stroke [facility])     Past Surgical History:  Procedure Laterality Date  . ABDOMINAL AORTOGRAM W/LOWER EXTREMITY N/A 08/16/2017   Procedure: ABDOMINAL AORTOGRAM W/LOWER EXTREMITY;  Surgeon: Angelia Mould, MD;  Location: [place] [facility];  Service: Cardiovascular;  Laterality: N/A;  . ABDOMINAL HYSTERECTOMY    . AORTA SURGERY     with sent  placement  . AORTIC ARCH ANGIOGRAPHY  08/16/2017   Procedure: AORTIC ARCH ANGIOGRAPHY/ INNOMINTE BYPASS ANGIOGRAPHY;  Surgeon: Angelia Mould, MD;  Location: [place] [facility];  Service: Cardiovascular;;  . CAROTID ENDARTERECTOMY Right   . CYSTOCELE REPAIR    . DILATION AND CURETTAGE OF UTERUS    . ENDARTERECTOMY Left 06/25/2017   Procedure: LEFT CAROTID ENDARTERECTOMY;  Surgeon: Angelia Mould, MD;  Location: [place];  Service: Vascular;  Laterality: Left;  . PATCH ANGIOPLASTY Left 06/25/2017   Procedure: PATCH ANGIOPLASTY OF LEFT CAROTID ARTERY USING HEMASHIELD PALTINUM FINESSE PATCH;  Surgeon: Angelia Mould, MD;  Location: [place];  Service: Vascular;  Laterality: Left;  . RECTOCELE REPAIR     x2    MEDICATIONS: . Albuterol Sulfate (PROAIR RESPICLICK) 646 (90 Base) MCG/ACT AEPB  . amoxicillin (AMOXIL) 500 MG capsule  . aspirin 81 MG chewable tablet  . clopidogrel (PLAVIX) 75 MG tablet  . Insulin Glargine (LANTUS SOLOSTAR) 100 UNIT/ML Solostar Pen  . varenicline (CHANTIX) 0.5 MG tablet   No current facility-administered medications for this encounter.    Amoxicillin is prescribed prior to dental procedures.   George Hugh [facility] Short Stay Center/Anesthesiology Phone [phone] 12/12/2017 1:09 PM

## 2017-12-18 ENCOUNTER — Inpatient Hospital Stay (HOSPITAL_COMMUNITY): Payer: Medicare PPO | Admitting: Certified Registered"

## 2017-12-18 ENCOUNTER — Inpatient Hospital Stay (HOSPITAL_COMMUNITY)
Admission: RE | Admit: 2017-12-18 | Discharge: 2017-12-19 | DRG: 036 | Disposition: A | Discharge status: Home / Self Care | Payer: Medicare PPO | Attending: Vascular Surgery | Admitting: Vascular Surgery

## 2017-12-18 ENCOUNTER — Encounter (HOSPITAL_COMMUNITY): Payer: Self-pay | Admitting: Certified Registered"

## 2017-12-18 ENCOUNTER — Other Ambulatory Visit: Payer: Self-pay

## 2017-12-18 ENCOUNTER — Inpatient Hospital Stay (HOSPITAL_COMMUNITY): Payer: Medicare PPO | Admitting: Vascular Surgery

## 2017-12-18 ENCOUNTER — Encounter (HOSPITAL_COMMUNITY)
Admission: RE | Disposition: A | Discharge status: Home / Self Care | Payer: Self-pay | Source: Home / Self Care | Attending: Vascular Surgery

## 2017-12-18 DIAGNOSIS — I97638 Postprocedural hematoma of a circulatory system organ or structure following other circulatory system procedure: Secondary | ICD-10-CM | POA: Diagnosis not present

## 2017-12-18 DIAGNOSIS — Z794 Long term (current) use of insulin: Secondary | ICD-10-CM | POA: Diagnosis not present

## 2017-12-18 DIAGNOSIS — Z79899 Other long term (current) drug therapy: Secondary | ICD-10-CM

## 2017-12-18 DIAGNOSIS — Y92239 Unspecified place in hospital as the place of occurrence of the external cause: Secondary | ICD-10-CM | POA: Diagnosis not present

## 2017-12-18 DIAGNOSIS — I2510 Atherosclerotic heart disease of native coronary artery without angina pectoris: Secondary | ICD-10-CM | POA: Diagnosis present

## 2017-12-18 DIAGNOSIS — D649 Anemia, unspecified: Secondary | ICD-10-CM | POA: Diagnosis present

## 2017-12-18 DIAGNOSIS — K219 Gastro-esophageal reflux disease without esophagitis: Secondary | ICD-10-CM | POA: Diagnosis present

## 2017-12-18 DIAGNOSIS — L237 Allergic contact dermatitis due to plants, except food: Secondary | ICD-10-CM | POA: Diagnosis present

## 2017-12-18 DIAGNOSIS — K449 Diaphragmatic hernia without obstruction or gangrene: Secondary | ICD-10-CM | POA: Diagnosis present

## 2017-12-18 DIAGNOSIS — M1990 Unspecified osteoarthritis, unspecified site: Secondary | ICD-10-CM | POA: Diagnosis present

## 2017-12-18 DIAGNOSIS — S1093XA Contusion of unspecified part of neck, initial encounter: Secondary | ICD-10-CM | POA: Diagnosis not present

## 2017-12-18 DIAGNOSIS — I6521 Occlusion and stenosis of right carotid artery: Secondary | ICD-10-CM | POA: Diagnosis present

## 2017-12-18 DIAGNOSIS — E1151 Type 2 diabetes mellitus with diabetic peripheral angiopathy without gangrene: Secondary | ICD-10-CM | POA: Diagnosis present

## 2017-12-18 DIAGNOSIS — Z7982 Long term (current) use of aspirin: Secondary | ICD-10-CM | POA: Diagnosis not present

## 2017-12-18 DIAGNOSIS — Z8673 Personal history of transient ischemic attack (TIA), and cerebral infarction without residual deficits: Secondary | ICD-10-CM | POA: Diagnosis not present

## 2017-12-18 DIAGNOSIS — Z7902 Long term (current) use of antithrombotics/antiplatelets: Secondary | ICD-10-CM | POA: Diagnosis not present

## 2017-12-18 DIAGNOSIS — Y848 Other medical procedures as the cause of abnormal reaction of the patient, or of later complication, without mention of misadventure at the time of the procedure: Secondary | ICD-10-CM | POA: Diagnosis not present

## 2017-12-18 DIAGNOSIS — J449 Chronic obstructive pulmonary disease, unspecified: Secondary | ICD-10-CM | POA: Diagnosis present

## 2017-12-18 HISTORY — PX: INSERTION OF RETROGRADE CAROTID STENT: SHX5869

## 2017-12-18 HISTORY — PX: HEMATOMA EVACUATION: SHX5118

## 2017-12-18 LAB — GLUCOSE, CAPILLARY
GLUCOSE-CAPILLARY: 151 mg/dL — AB (ref 70–99)
GLUCOSE-CAPILLARY: 119 mg/dL — AB (ref 70–99)
GLUCOSE-CAPILLARY: 192 mg/dL — AB (ref 70–99)
Glucose-Capillary: 105 mg/dL — ABNORMAL HIGH (ref 70–99)
Glucose-Capillary: 173 mg/dL — ABNORMAL HIGH (ref 70–99)
Glucose-Capillary: 189 mg/dL — ABNORMAL HIGH (ref 70–99)
Glucose-Capillary: 178 mg/dL — ABNORMAL HIGH (ref 70–99)

## 2017-12-18 SURGERY — EVACUATION HEMATOMA
Anesthesia: General | Site: Neck | Laterality: Right

## 2017-12-18 SURGERY — INSERTION, STENT, ARTERY, CAROTID, RETROGRADE
Anesthesia: General | Site: Neck | Laterality: Right

## 2017-12-18 MED ORDER — SUCCINYLCHOLINE CHLORIDE 200 MG/10ML IV SOSY
PREFILLED_SYRINGE | INTRAVENOUS | Status: AC
  Filled 2017-12-18: qty 10

## 2017-12-18 MED ORDER — LIDOCAINE HCL (PF) 1 % IJ SOLN
INTRAMUSCULAR | Status: AC
  Filled 2017-12-18: qty 30

## 2017-12-18 MED ORDER — ACETAMINOPHEN 325 MG PO TABS: 325.0000 mg | ORAL_TABLET | ORAL | Status: DC | PRN

## 2017-12-18 MED ORDER — LIDOCAINE 2% (20 MG/ML) 5 ML SYRINGE
INTRAMUSCULAR | Status: AC
  Filled 2017-12-18: qty 5

## 2017-12-18 MED ORDER — HYDRALAZINE HCL 20 MG/ML IJ SOLN: 5.0000 mg | INTRAMUSCULAR | Status: DC | PRN

## 2017-12-18 MED ORDER — LIDOCAINE 2% (20 MG/ML) 5 ML SYRINGE
INTRAMUSCULAR | Status: DC | PRN
  Administered 2017-12-18: 60 mg via INTRAVENOUS

## 2017-12-18 MED ORDER — CEFAZOLIN SODIUM 1 G IJ SOLR
INTRAMUSCULAR | Status: AC
  Filled 2017-12-18: qty 20

## 2017-12-18 MED ORDER — ROCURONIUM BROMIDE 10 MG/ML (PF) SYRINGE
PREFILLED_SYRINGE | INTRAVENOUS | Status: DC | PRN
  Administered 2017-12-18: 50 mg via INTRAVENOUS

## 2017-12-18 MED ORDER — ROCURONIUM BROMIDE 10 MG/ML (PF) SYRINGE
PREFILLED_SYRINGE | INTRAVENOUS | Status: AC
  Filled 2017-12-18: qty 10

## 2017-12-18 MED ORDER — LACTATED RINGERS IV SOLN
INTRAVENOUS | Status: DC | PRN
  Administered 2017-12-18: 10:00:00 via INTRAVENOUS

## 2017-12-18 MED ORDER — CEFAZOLIN SODIUM-DEXTROSE 2-3 GM-%(50ML) IV SOLR
INTRAVENOUS | Status: DC | PRN
  Administered 2017-12-18: 2 g via INTRAVENOUS

## 2017-12-18 MED ORDER — CLOPIDOGREL BISULFATE 75 MG PO TABS
75.0000 mg | ORAL_TABLET | Freq: Every day | ORAL | Status: DC
  Administered 2017-12-19: 75 mg via ORAL
  Filled 2017-12-18: qty 1

## 2017-12-18 MED ORDER — DEXAMETHASONE SODIUM PHOSPHATE 10 MG/ML IJ SOLN
INTRAMUSCULAR | Status: DC | PRN
  Administered 2017-12-18: 5 mg via INTRAVENOUS

## 2017-12-18 MED ORDER — LIDOCAINE 2% (20 MG/ML) 5 ML SYRINGE
INTRAMUSCULAR | Status: DC | PRN
  Administered 2017-12-18: 80 mg via INTRAVENOUS

## 2017-12-18 MED ORDER — DOCUSATE SODIUM 100 MG PO CAPS
100.0000 mg | ORAL_CAPSULE | Freq: Every day | ORAL | Status: DC
  Administered 2017-12-19: 100 mg via ORAL
  Filled 2017-12-18: qty 1

## 2017-12-18 MED ORDER — PROPOFOL 10 MG/ML IV BOLUS
INTRAVENOUS | Status: AC
  Filled 2017-12-18: qty 40

## 2017-12-18 MED ORDER — EPHEDRINE SULFATE-NACL 50-0.9 MG/10ML-% IV SOSY
PREFILLED_SYRINGE | INTRAVENOUS | Status: DC | PRN
  Administered 2017-12-18 (×2): 5 mg via INTRAVENOUS

## 2017-12-18 MED ORDER — SODIUM CHLORIDE 0.9 % IV SOLN
INTRAVENOUS | Status: DC | PRN
  Administered 2017-12-18: 500 mL

## 2017-12-18 MED ORDER — PHENYLEPHRINE HCL 10 MG/ML IJ SOLN
INTRAMUSCULAR | Status: DC | PRN
  Administered 2017-12-18: 30 ug/min via INTRAVENOUS

## 2017-12-18 MED ORDER — SODIUM CHLORIDE 0.9 % IV SOLN
0.0125 ug/kg/min | INTRAVENOUS | Status: AC
  Administered 2017-12-18: .05 ug/kg/min via INTRAVENOUS
  Filled 2017-12-18: qty 2000

## 2017-12-18 MED ORDER — DEXAMETHASONE SODIUM PHOSPHATE 10 MG/ML IJ SOLN
INTRAMUSCULAR | Status: AC
  Filled 2017-12-18: qty 1

## 2017-12-18 MED ORDER — PHENYLEPHRINE 40 MCG/ML (10ML) SYRINGE FOR IV PUSH (FOR BLOOD PRESSURE SUPPORT)
PREFILLED_SYRINGE | INTRAVENOUS | Status: AC
  Filled 2017-12-18: qty 10

## 2017-12-18 MED ORDER — HYDRALAZINE HCL 20 MG/ML IJ SOLN
INTRAMUSCULAR | Status: AC
  Filled 2017-12-18: qty 1

## 2017-12-18 MED ORDER — SODIUM CHLORIDE 0.9 % IV SOLN: 500.0000 mL | Freq: Once | INTRAVENOUS | Status: DC | PRN

## 2017-12-18 MED ORDER — GUAIFENESIN-DM 100-10 MG/5ML PO SYRP: 15.0000 mL | ORAL_SOLUTION | ORAL | Status: DC | PRN

## 2017-12-18 MED ORDER — PROPOFOL 10 MG/ML IV BOLUS
INTRAVENOUS | Status: DC | PRN
  Administered 2017-12-18: 160 mg via INTRAVENOUS

## 2017-12-18 MED ORDER — ONDANSETRON HCL 4 MG/2ML IJ SOLN
INTRAMUSCULAR | Status: DC | PRN
  Administered 2017-12-18: 4 mg via INTRAVENOUS

## 2017-12-18 MED ORDER — IODIXANOL 320 MG/ML IV SOLN
INTRAVENOUS | Status: DC | PRN
  Administered 2017-12-18: 60 mL

## 2017-12-18 MED ORDER — HYDROMORPHONE HCL 1 MG/ML IJ SOLN
INTRAMUSCULAR | Status: AC
  Filled 2017-12-18: qty 1

## 2017-12-18 MED ORDER — CHLORHEXIDINE GLUCONATE CLOTH 2 % EX PADS: 6.0000 | MEDICATED_PAD | Freq: Once | CUTANEOUS | Status: DC

## 2017-12-18 MED ORDER — VARENICLINE TARTRATE 0.5 MG PO TABS
0.5000 mg | ORAL_TABLET | Freq: Two times a day (BID) | ORAL | Status: DC
  Administered 2017-12-18 – 2017-12-19 (×2): 0.5 mg via ORAL
  Filled 2017-12-18 (×3): qty 1

## 2017-12-18 MED ORDER — SODIUM CHLORIDE 0.9 % IV SOLN
INTRAVENOUS | Status: AC
  Filled 2017-12-18: qty 1.2

## 2017-12-18 MED ORDER — PROPOFOL 10 MG/ML IV BOLUS
INTRAVENOUS | Status: AC
  Filled 2017-12-18: qty 20

## 2017-12-18 MED ORDER — FENTANYL CITRATE (PF) 250 MCG/5ML IJ SOLN
INTRAMUSCULAR | Status: AC
  Filled 2017-12-18: qty 5

## 2017-12-18 MED ORDER — LIDOCAINE 2% (20 MG/ML) 5 ML SYRINGE
INTRAMUSCULAR | Status: AC
  Filled 2017-12-18: qty 10

## 2017-12-18 MED ORDER — CEFAZOLIN SODIUM-DEXTROSE 2-4 GM/100ML-% IV SOLN
2.0000 g | Freq: Three times a day (TID) | INTRAVENOUS | Status: AC
  Administered 2017-12-19 (×2): 2 g via INTRAVENOUS
  Filled 2017-12-18 (×2): qty 100

## 2017-12-18 MED ORDER — HYDROMORPHONE HCL 1 MG/ML IJ SOLN
INTRAMUSCULAR | Status: AC
  Administered 2017-12-18: 0.5 mg via INTRAVENOUS
  Filled 2017-12-18: qty 1

## 2017-12-18 MED ORDER — SODIUM CHLORIDE 0.9 % IV SOLN: INTRAVENOUS | Status: DC

## 2017-12-18 MED ORDER — HEMOSTATIC AGENTS (NO CHARGE) OPTIME
TOPICAL | Status: DC | PRN
  Administered 2017-12-18: 1 via TOPICAL

## 2017-12-18 MED ORDER — SUGAMMADEX SODIUM 200 MG/2ML IV SOLN
INTRAVENOUS | Status: DC | PRN
  Administered 2017-12-18: 200 mg via INTRAVENOUS

## 2017-12-18 MED ORDER — POLYETHYLENE GLYCOL 3350 17 G PO PACK: 17.0000 g | PACK | Freq: Every day | ORAL | Status: DC | PRN

## 2017-12-18 MED ORDER — ACETAMINOPHEN 325 MG RE SUPP: 325.0000 mg | RECTAL | Status: DC | PRN

## 2017-12-18 MED ORDER — ASPIRIN 81 MG PO CHEW
81.0000 mg | CHEWABLE_TABLET | Freq: Every day | ORAL | Status: DC
  Administered 2017-12-19: 81 mg via ORAL
  Filled 2017-12-18: qty 1

## 2017-12-18 MED ORDER — CEFAZOLIN SODIUM-DEXTROSE 2-4 GM/100ML-% IV SOLN
2.0000 g | INTRAVENOUS | Status: AC
  Administered 2017-12-18: 2 g via INTRAVENOUS
  Filled 2017-12-18: qty 100

## 2017-12-18 MED ORDER — PHENYLEPHRINE 40 MCG/ML (10ML) SYRINGE FOR IV PUSH (FOR BLOOD PRESSURE SUPPORT)
PREFILLED_SYRINGE | INTRAVENOUS | Status: DC | PRN
  Administered 2017-12-18 (×2): 80 ug via INTRAVENOUS
  Administered 2017-12-18: 120 ug via INTRAVENOUS

## 2017-12-18 MED ORDER — HYDROMORPHONE HCL 1 MG/ML IJ SOLN
0.2500 mg | INTRAMUSCULAR | Status: DC | PRN
  Administered 2017-12-18 (×3): 0.5 mg via INTRAVENOUS
  Administered 2017-12-18 (×2): 0.25 mg via INTRAVENOUS
  Administered 2017-12-18 (×2): 0.5 mg via INTRAVENOUS

## 2017-12-18 MED ORDER — PHENOL 1.4 % MT LIQD: 1.0000 | OROMUCOSAL | Status: DC | PRN

## 2017-12-18 MED ORDER — METOPROLOL TARTRATE 5 MG/5ML IV SOLN: 2.0000 mg | INTRAVENOUS | Status: DC | PRN

## 2017-12-18 MED ORDER — HEPARIN SODIUM (PORCINE) 1000 UNIT/ML IJ SOLN
INTRAMUSCULAR | Status: AC
  Filled 2017-12-18: qty 1

## 2017-12-18 MED ORDER — PANTOPRAZOLE SODIUM 40 MG PO TBEC
40.0000 mg | DELAYED_RELEASE_TABLET | Freq: Every day | ORAL | Status: DC
  Administered 2017-12-18 – 2017-12-19 (×2): 40 mg via ORAL
  Filled 2017-12-18 (×2): qty 1

## 2017-12-18 MED ORDER — ALBUTEROL SULFATE (2.5 MG/3ML) 0.083% IN NEBU: 2.5000 mg | INHALATION_SOLUTION | Freq: Four times a day (QID) | RESPIRATORY_TRACT | Status: DC | PRN

## 2017-12-18 MED ORDER — ONDANSETRON HCL 4 MG/2ML IJ SOLN
INTRAMUSCULAR | Status: AC
  Filled 2017-12-18: qty 2

## 2017-12-18 MED ORDER — ALUM & MAG HYDROXIDE-SIMETH 200-200-20 MG/5ML PO SUSP: 15.0000 mL | ORAL | Status: DC | PRN

## 2017-12-18 MED ORDER — LABETALOL HCL 5 MG/ML IV SOLN
INTRAVENOUS | Status: AC
  Filled 2017-12-18: qty 4

## 2017-12-18 MED ORDER — MAGNESIUM SULFATE 2 GM/50ML IV SOLN: 2.0000 g | Freq: Every day | INTRAVENOUS | Status: DC | PRN

## 2017-12-18 MED ORDER — PROPOFOL 10 MG/ML IV BOLUS
INTRAVENOUS | Status: DC | PRN
  Administered 2017-12-18: 120 mg via INTRAVENOUS

## 2017-12-18 MED ORDER — INSULIN ASPART 100 UNIT/ML ~~LOC~~ SOLN
0.0000 [IU] | Freq: Three times a day (TID) | SUBCUTANEOUS | Status: DC
  Administered 2017-12-19: 3 [IU] via SUBCUTANEOUS

## 2017-12-18 MED ORDER — MORPHINE SULFATE (PF) 2 MG/ML IV SOLN
2.0000 mg | INTRAVENOUS | Status: DC | PRN
  Administered 2017-12-19 (×2): 2 mg via INTRAVENOUS
  Filled 2017-12-18 (×2): qty 1

## 2017-12-18 MED ORDER — LABETALOL HCL 5 MG/ML IV SOLN
10.0000 mg | INTRAVENOUS | Status: AC | PRN
  Administered 2017-12-18 (×3): 10 mg via INTRAVENOUS

## 2017-12-18 MED ORDER — OXYCODONE-ACETAMINOPHEN 5-325 MG PO TABS
1.0000 | ORAL_TABLET | ORAL | Status: DC | PRN
  Administered 2017-12-18 – 2017-12-19 (×2): 2 via ORAL
  Filled 2017-12-18 (×2): qty 2

## 2017-12-18 MED ORDER — LABETALOL HCL 5 MG/ML IV SOLN
INTRAVENOUS | Status: AC
  Administered 2017-12-18: 10 mg via INTRAVENOUS
  Filled 2017-12-18: qty 4

## 2017-12-18 MED ORDER — PHENYLEPHRINE HCL 10 MG/ML IJ SOLN
INTRAMUSCULAR | Status: DC | PRN
  Administered 2017-12-18: 100 ug/min via INTRAVENOUS

## 2017-12-18 MED ORDER — LACTATED RINGERS IV SOLN
INTRAVENOUS | Status: DC | PRN
  Administered 2017-12-18: 18:00:00 via INTRAVENOUS

## 2017-12-18 MED ORDER — MIDAZOLAM HCL 2 MG/2ML IJ SOLN
INTRAMUSCULAR | Status: AC
  Filled 2017-12-18: qty 2

## 2017-12-18 MED ORDER — POTASSIUM CHLORIDE CRYS ER 20 MEQ PO TBCR: 20.0000 meq | EXTENDED_RELEASE_TABLET | Freq: Every day | ORAL | Status: DC | PRN

## 2017-12-18 MED ORDER — PROTAMINE SULFATE 10 MG/ML IV SOLN
INTRAVENOUS | Status: AC
  Filled 2017-12-18: qty 5

## 2017-12-18 MED ORDER — SODIUM CHLORIDE 0.9 % IR SOLN
Status: DC | PRN
  Administered 2017-12-18 (×2): 1000 mL

## 2017-12-18 MED ORDER — SODIUM CHLORIDE 0.9 % IR SOLN
Status: DC | PRN
  Administered 2017-12-18: 2000 mL

## 2017-12-18 MED ORDER — FENTANYL CITRATE (PF) 250 MCG/5ML IJ SOLN
INTRAMUSCULAR | Status: DC | PRN
  Administered 2017-12-18: 50 ug via INTRAVENOUS

## 2017-12-18 MED ORDER — HEPARIN SODIUM (PORCINE) 1000 UNIT/ML IJ SOLN
INTRAMUSCULAR | Status: DC | PRN
  Administered 2017-12-18: 7000 [IU] via INTRAVENOUS

## 2017-12-18 MED ORDER — ONDANSETRON HCL 4 MG/2ML IJ SOLN: 4.0000 mg | Freq: Four times a day (QID) | INTRAMUSCULAR | Status: DC | PRN

## 2017-12-18 MED ORDER — HEPARIN SODIUM (PORCINE) 5000 UNIT/ML IJ SOLN
INTRAMUSCULAR | Status: AC
  Filled 2017-12-18: qty 1.2

## 2017-12-18 MED ORDER — EPHEDRINE 5 MG/ML INJ
INTRAVENOUS | Status: AC
  Filled 2017-12-18: qty 10

## 2017-12-18 MED ORDER — BISACODYL 10 MG RE SUPP: 10.0000 mg | Freq: Every day | RECTAL | Status: DC | PRN

## 2017-12-18 SURGICAL SUPPLY — 60 items
BANDAGE ESMARK 6X9 LF (GAUZE/BANDAGES/DRESSINGS) IMPLANT
BNDG ESMARK 6X9 LF (GAUZE/BANDAGES/DRESSINGS)
CANISTER SUCT 3000ML PPV (MISCELLANEOUS) ×3 IMPLANT
CATH ROBINSON RED A/P 18FR (CATHETERS) ×3 IMPLANT
CATH SUCT 10FR WHISTLE TIP (CATHETERS) ×3 IMPLANT
CLIP VESOCCLUDE MED 6/CT (CLIP) ×3 IMPLANT
CLIP VESOCCLUDE SM WIDE 6/CT (CLIP) ×3 IMPLANT
COVER PROBE W GEL 5X96 (DRAPES) IMPLANT
CRADLE DONUT ADULT HEAD (MISCELLANEOUS) ×3 IMPLANT
CUFF TOURNIQUET SINGLE 18IN (TOURNIQUET CUFF) IMPLANT
CUFF TOURNIQUET SINGLE 24IN (TOURNIQUET CUFF) IMPLANT
CUFF TOURNIQUET SINGLE 34IN LL (TOURNIQUET CUFF) IMPLANT
CUFF TOURNIQUET SINGLE 44IN (TOURNIQUET CUFF) IMPLANT
DERMABOND ADVANCED (GAUZE/BANDAGES/DRESSINGS) ×2
DERMABOND ADVANCED .7 DNX12 (GAUZE/BANDAGES/DRESSINGS) ×1 IMPLANT
DRAIN CHANNEL 15F RND FF W/TCR (WOUND CARE) ×3 IMPLANT
ELECT REM PT RETURN 9FT ADLT (ELECTROSURGICAL) ×6
ELECTRODE REM PT RTRN 9FT ADLT (ELECTROSURGICAL) ×2 IMPLANT
EVACUATOR SILICONE 100CC (DRAIN) ×3 IMPLANT
GAUZE SPONGE 2X2 8PLY STRL LF (GAUZE/BANDAGES/DRESSINGS) ×1 IMPLANT
GLOVE BIOGEL PI IND STRL 7.5 (GLOVE) ×1 IMPLANT
GLOVE BIOGEL PI INDICATOR 7.5 (GLOVE) ×2
GLOVE SURG SS PI 7.5 STRL IVOR (GLOVE) ×3 IMPLANT
GOWN STRL REUS W/ TWL XL LVL3 (GOWN DISPOSABLE) ×1 IMPLANT
GOWN STRL REUS W/TWL XL LVL3 (GOWN DISPOSABLE) ×2
HEMOSTAT SNOW SURGICEL 2X4 (HEMOSTASIS) IMPLANT
INSERT FOGARTY SM (MISCELLANEOUS) IMPLANT
KIT BASIN OR (CUSTOM PROCEDURE TRAY) ×3 IMPLANT
KIT SHUNT ARGYLE CAROTID ART 6 (VASCULAR PRODUCTS) IMPLANT
KIT TURNOVER KIT B (KITS) ×3 IMPLANT
NEEDLE HYPO 25GX1X1/2 BEV (NEEDLE) IMPLANT
NS IRRIG 1000ML POUR BTL (IV SOLUTION) ×6 IMPLANT
PACK CAROTID (CUSTOM PROCEDURE TRAY) ×3 IMPLANT
PACK CV ACCESS (CUSTOM PROCEDURE TRAY) IMPLANT
PACK GENERAL/GYN (CUSTOM PROCEDURE TRAY) IMPLANT
PACK PERIPHERAL VASCULAR (CUSTOM PROCEDURE TRAY) IMPLANT
PAD ARMBOARD 7.5X6 YLW CONV (MISCELLANEOUS) ×6 IMPLANT
POWDER SURGICEL 3.0 GRAM (HEMOSTASIS) ×3 IMPLANT
SHUNT CAROTID BYPASS 10 (VASCULAR PRODUCTS) IMPLANT
SHUNT CAROTID BYPASS 12FRX15.5 (VASCULAR PRODUCTS) IMPLANT
SPONGE GAUZE 2X2 STER 10/PKG (GAUZE/BANDAGES/DRESSINGS) ×2
STAPLER VISISTAT 35W (STAPLE) IMPLANT
SUT ETHILON 3 0 PS 1 (SUTURE) ×3 IMPLANT
SUT MNCRL AB 4-0 PS2 18 (SUTURE) IMPLANT
SUT PROLENE 5 0 C 1 24 (SUTURE) IMPLANT
SUT PROLENE 6 0 BV (SUTURE) ×3 IMPLANT
SUT PROLENE 7 0 BV 1 (SUTURE) IMPLANT
SUT SILK 3 0 (SUTURE)
SUT SILK 3-0 18XBRD TIE 12 (SUTURE) IMPLANT
SUT VIC AB 2-0 CT1 27 (SUTURE)
SUT VIC AB 2-0 CT1 TAPERPNT 27 (SUTURE) IMPLANT
SUT VIC AB 3-0 SH 27 (SUTURE) ×6
SUT VIC AB 3-0 SH 27X BRD (SUTURE) ×3 IMPLANT
SUT VICRYL 4-0 PS2 18IN ABS (SUTURE) ×3 IMPLANT
SYR CONTROL 10ML LL (SYRINGE) IMPLANT
TAPE CLOTH SURG 4X10 WHT LF (GAUZE/BANDAGES/DRESSINGS) ×3 IMPLANT
TOWEL GREEN STERILE (TOWEL DISPOSABLE) ×3 IMPLANT
TRAY FOLEY MTR SLVR 16FR STAT (SET/KITS/TRAYS/PACK) IMPLANT
UNDERPAD 30X30 (UNDERPADS AND DIAPERS) IMPLANT
WATER STERILE IRR 1000ML POUR (IV SOLUTION) ×3 IMPLANT

## 2017-12-18 SURGICAL SUPPLY — 53 items
BAG BANDED W/RUBBER/TAPE 36X54 (MISCELLANEOUS) ×2 IMPLANT
CANISTER SUCT 3000ML PPV (MISCELLANEOUS) ×2 IMPLANT
CANNULA VESSEL 3MM 2 BLNT TIP (CANNULA) ×4 IMPLANT
CATH ROBINSON RED A/P 18FR (CATHETERS) ×2 IMPLANT
CLIP VESOCCLUDE MED 24/CT (CLIP) ×2 IMPLANT
CLIP VESOCCLUDE SM WIDE 24/CT (CLIP) ×2 IMPLANT
COVER BACK TABLE 60X90IN (DRAPES) ×2 IMPLANT
COVER DOME SNAP 22 D (MISCELLANEOUS) ×2 IMPLANT
CRADLE DONUT ADULT HEAD (MISCELLANEOUS) ×2 IMPLANT
DERMABOND ADVANCED (GAUZE/BANDAGES/DRESSINGS) ×1
DERMABOND ADVANCED .7 DNX12 (GAUZE/BANDAGES/DRESSINGS) ×1 IMPLANT
DEVICE INFLATION ENCORE 26 (MISCELLANEOUS) ×2 IMPLANT
DEVICE TORQUE 50000 (MISCELLANEOUS) IMPLANT
DRAIN CHANNEL 15F RND FF W/TCR (WOUND CARE) IMPLANT
ELECT REM PT RETURN 9FT ADLT (ELECTROSURGICAL) ×2
ELECTRODE REM PT RTRN 9FT ADLT (ELECTROSURGICAL) ×1 IMPLANT
EVACUATOR SILICONE 100CC (DRAIN) IMPLANT
GLOVE BIO SURGEON STRL SZ 6.5 (GLOVE) ×2 IMPLANT
GLOVE BIO SURGEON STRL SZ7.5 (GLOVE) ×4 IMPLANT
GLOVE BIOGEL PI IND STRL 6.5 (GLOVE) ×1 IMPLANT
GLOVE BIOGEL PI INDICATOR 6.5 (GLOVE) ×1
GLOVE SURG SS PI 6.5 STRL IVOR (GLOVE) ×2 IMPLANT
GOWN STRL NON-REIN LRG LVL3 (GOWN DISPOSABLE) ×2 IMPLANT
GOWN STRL REUS W/ TWL LRG LVL3 (GOWN DISPOSABLE) ×3 IMPLANT
GOWN STRL REUS W/TWL LRG LVL3 (GOWN DISPOSABLE) ×3
GUIDEWIRE ANGLED .035X150CM (WIRE) IMPLANT
GUIDEWIRE WHOLEY HI TOR 145CM (WIRE) IMPLANT
KIT BASIN OR (CUSTOM PROCEDURE TRAY) ×2 IMPLANT
KIT ENCORE 26 ADVANTAGE (KITS) ×2 IMPLANT
KIT TURNOVER KIT B (KITS) ×2 IMPLANT
NEEDLE PERC 18GX7CM (NEEDLE) ×2 IMPLANT
NS IRRIG 1000ML POUR BTL (IV SOLUTION) ×4 IMPLANT
PACK CAROTID (CUSTOM PROCEDURE TRAY) ×2 IMPLANT
PAD ARMBOARD 7.5X6 YLW CONV (MISCELLANEOUS) ×4 IMPLANT
SET MICROPUNCTURE 5F STIFF (MISCELLANEOUS) ×2 IMPLANT
SHEATH AVANTI 11CM 5FR (SHEATH) IMPLANT
SHEATH BRITE TIP 7FR 35CM (SHEATH) IMPLANT
SHEATH PINNACLE R/O II 7F 4CM (SHEATH) ×2 IMPLANT
SHUNT CAROTID BYPASS 10 (VASCULAR PRODUCTS) IMPLANT
SHUNT CAROTID BYPASS 12FRX15.5 (VASCULAR PRODUCTS) IMPLANT
SPONGE SURGIFOAM ABS GEL 100 (HEMOSTASIS) IMPLANT
STENT VIABAHN 7X29X80 VBX (Permanent Stent) ×2 IMPLANT
SUT MNCRL AB 4-0 PS2 18 (SUTURE) ×2 IMPLANT
SUT PROLENE 6 0 CC (SUTURE) ×8 IMPLANT
SUT VIC AB 3-0 SH 27 (SUTURE) ×1
SUT VIC AB 3-0 SH 27X BRD (SUTURE) ×1 IMPLANT
SYR 10ML LL (SYRINGE) ×6 IMPLANT
SYRINGE 20CC LL (MISCELLANEOUS) ×4 IMPLANT
TOWEL GREEN STERILE (TOWEL DISPOSABLE) ×2 IMPLANT
TRAY FOLEY MTR SLVR 16FR STAT (SET/KITS/TRAYS/PACK) ×2 IMPLANT
WATER STERILE IRR 1000ML POUR (IV SOLUTION) ×2 IMPLANT
WIRE BENTSON .035X145CM (WIRE) ×2 IMPLANT
WIRE ROSEN 145CM (WIRE) IMPLANT

## 2017-12-18 NOTE — Anesthesia Postprocedure Evaluation (Signed)
Anesthesia Post Note  Patient: Isabella [name]  Procedure(s) Performed: EVACUATION HEMATOMA (Right Neck)     Patient location during evaluation: PACU Anesthesia Type: General Level of consciousness: awake and alert Pain management: pain level controlled Vital Signs Assessment: post-procedure vital signs reviewed and stable Respiratory status: spontaneous breathing, nonlabored ventilation, respiratory function stable and patient connected to nasal cannula oxygen Cardiovascular status: blood pressure returned to baseline and stable Postop Assessment: no apparent nausea or vomiting Anesthetic complications: no    Last Vitals:  Vitals:   12/18/17 2005 12/18/17 2042  BP: (!) 120/59 120/65  Pulse: 83 89  Resp: 10 14  Temp:  36.6 C  SpO2: 96% 100%    Last Pain:  Vitals:   12/18/17 2042  TempSrc: Oral  PainSc:                  Kennieth RadFitzgerald, Kayton Dunaj E

## 2017-12-18 NOTE — Op Note (Signed)
Procedure: Exposure of right common carotid artery, right common carotid stent placement (7 x 29 VBX)  Preoperative diagnosis: Right common carotid stenosis  Postoperative diagnosis: Same  Anesthesia: General  Asst: Doreatha MassedSamantha Rhyne, PA-C  Co-surgeon: Lemar LivingsBrandon Cain, MD  Operative findings: 90% proximal common carotid artery stenosis stented to 0% residual stenosis 7 x 29 VBX  Operative details: After obtaining informed consent, the patient was taken the operating.  The patient was placed in supine position operating table.  After induction of general anesthesia and endotracheal elevation the patient's entire right neck and chest were prepped and draped in usual sterile fashion.  Next a longitudinal incision was made at the base of the right neck through a pre-existing carotid endarterectomy scar.  Incision was carried down through subtenons tissues down level common carotid artery.  The vagus nerve was identified and protected.  Jugular vein was reflected laterally.  Common carotid artery was dissected free circumferentially and a vessel loop placed around this.  There was diminished pulse within the common carotid artery.  At this point a micropuncture needle was used to cannulate the common carotid artery as high as possible in the incision.  Micropuncture sheath was placed over this.  Contrast angiogram was then performed.  This showed the narrowing of the common carotid artery but I did not have enough room between the tip of the sheath to place a stent in this location.  Therefore the the guidewire and sheath were removed and the hole repaired with a single 6-0 Prolene suture.  I then extended the incision more distally on the common carotid artery just below the carotid bifurcation.  At this point I repunctured the artery and performed an additional angiograms or micropuncture sheath.  This showed that we did have enough room that I could see the full extent of the lesion.  An 39035 Bentson wire was  advanced through the micropuncture sheath into the ascending aorta.  A 7 French sheath was then placed over this into the common carotid artery.  A 7 x 29 VBX stent was then advanced over this into the the artery and using roadmapping techniques placed and centered over the lesion.  At this point at the tip of the stent was still within the sheath.  Therefore the sheath was totally removed and we deployed the stent bareback to nominal pressure and then a completion angiogram was performed again through the micropuncture sheath.  This showed the common carotid stent was widely patent with no evidence of dissection.  There was good distal flow.  At this point the hole was repaired with two 6-0 Prolene sutures.  Hemostasis was obtained.  The platysma was reapproximated using a running 3-0 Vicryl suture.  The skin was closed with a 4-0 Vicryl subcuticular stitch.  The patient tired the procedure well and there were no complications.  The instrument sponge needle count was correct the end of the case.  The patient was taken to recovery room in stable condition.  Patient was moving upper extremity and lower extremity symmetrically and following commands at the conclusion of the case.  Isabella Brunsharles Laylani Pudwill, MD [facility] [place] Office: [phone] Pager: [phone]

## 2017-12-18 NOTE — Interval H&P Note (Signed)
History and Physical Interval Note:  12/18/2017 11:25 AM  Isabella [name]  has presented today for surgery, with the diagnosis of right carotid stenosis  The various methods of treatment have been discussed with the patient and family. After consideration of risks, benefits and other options for treatment, the patient has consented to  Procedure(s): INSERTION OF RETROGRADE CAROTID STENT (Right) as a surgical intervention .  The patient's history has been reviewed, patient examined, no change in status, stable for surgery.  I have reviewed the patient's chart and labs.  Questions were answered to the patient's satisfaction.     Fabienne Brunsharles Jaidyn Kuhl

## 2017-12-18 NOTE — Anesthesia Preprocedure Evaluation (Signed)
Anesthesia Evaluation  Patient identified by MRN, date of birth, ID band Patient awake    Reviewed: Allergy & Precautions, NPO status , Patient's Chart, lab work & pertinent test results  Airway Mallampati: II  TM Distance: >3 FB Neck ROM: Full    Dental  (+) Dental Advisory Given   Pulmonary pneumonia, COPD, Current Smoker,    breath sounds clear to auscultation       Cardiovascular + CAD and + Peripheral Vascular Disease   Rhythm:Regular Rate:Normal     Neuro/Psych    GI/Hepatic Neg liver ROS, hiatal hernia, GERD  ,  Endo/Other  diabetes  Renal/GU negative Renal ROS     Musculoskeletal  (+) Arthritis ,   Abdominal   Peds  Hematology  (+) anemia ,   Anesthesia Other Findings   Reproductive/Obstetrics                             Anesthesia Physical Anesthesia Plan  ASA: III and emergent  Anesthesia Plan: General   Post-op Pain Management:    Induction: Intravenous  PONV Risk Score and Plan: 2 and Ondansetron and Dexamethasone  Airway Management Planned: Oral ETT  Additional Equipment: Arterial line  Intra-op Plan:   Post-operative Plan: Extubation in OR  Informed Consent: I have reviewed the patients History and Physical, chart, labs and discussed the procedure including the risks, benefits and alternatives for the proposed anesthesia with the patient or authorized representative who has indicated his/her understanding and acceptance.   Dental advisory given  Plan Discussed with: CRNA and Surgeon  Anesthesia Plan Comments:         Anesthesia Quick Evaluation

## 2017-12-18 NOTE — Op Note (Signed)
    Patient name: Isabella MoutonLisa Rae [name] MRN: [id number] DOB: 1966-05-10 Sex: female  12/18/2017 Pre-operative Diagnosis: right neck hematoma Post-operative diagnosis:  Same Surgeon:  Durene CalWells Brabham Assistants:  Emilio AspenEleanor Aguilar Procedure:   Evacuation of right neck hematoma Anesthesia:  General Blood Loss:  minimal Specimens: None  Findings: Gelatinous material within the incision creating most of the edema.  There is also a fair amount of blood.  No obvious source of bleeding was found however there was areas on the muscle bellies that were cauterized.  Indications: Earlier today, the patient underwent retrograde right common carotid artery stenting.  She developed a hematoma in the PACU and is brought back to the operating room under emergency consent for evacuation of hematoma.  Procedure:  The patient was identified in the holding area [facility]  The patient was then placed supine on the table. general anesthesia was administered.  The patient was prepped and draped in the usual sterile fashion.  A time out was called and antibiotics were administered.  The patient's previous right neck incision was opened by removing the Dermabond and using a 15 blade to cut the sutures.  Upon entering the incision, there was clear gelatinous material within the wound.  This was removed and then a moderate size hematoma was encountered.  There was not a lot of clot within the wound.  It was then irrigated.  No obvious source of bleeding was identified however there were multiple areas within the muscle bellies that were cauterized.  I then used Surgicel powder to help with hemostasis.  Manual pressure was then held and the wound was observed for a while to make sure there was no obvious bleeding.  I elected to place a drain.  A 15 Blake was brought out through a separate skin incision and sutured in position.  I then reapproximated the carotid sheath with 3-0 Vicryl.  The subcutaneous tissue including the  platysma muscle was closed with 3-0 Vicryl followed by 3-0 Vicryl in the skin and Dermabond.  There were no immediate complications.   Disposition: To PACU stable   V. Durene CalWells Brabham, M.D. [facility] [place] Office: [phone] Pager:  [phone]

## 2017-12-18 NOTE — Anesthesia Procedure Notes (Signed)
Procedure Name: Intubation Date/Time: 12/18/2017 11:49 AM Performed by: Myna Bright, CRNA Pre-anesthesia Checklist: Patient identified, Emergency Drugs available, Suction available and Patient being monitored Patient Re-evaluated:Patient Re-evaluated prior to induction Oxygen Delivery Method: Circle system utilized Preoxygenation: Pre-oxygenation with 100% oxygen Induction Type: IV induction Ventilation: Mask ventilation without difficulty Laryngoscope Size: Mac and 3 Grade View: Grade I Tube type: Oral Tube size: 7.0 mm Number of attempts: 1 Airway Equipment and Method: Stylet Placement Confirmation: ETT inserted through vocal cords under direct vision,  positive ETCO2 and breath sounds checked- equal and bilateral Secured at: 21 cm Tube secured with: Tape Dental Injury: Teeth and Oropharynx as per pre-operative assessment

## 2017-12-18 NOTE — Transfer of Care (Signed)
Immediate Anesthesia Transfer of Care Note  Patient: Isabella [name]  Procedure(s) Performed: INSERTION OF RETROGRADE CAROTID STENT (Right Neck)  Patient Location: PACU  Anesthesia Type:General  Level of Consciousness: awake, alert , oriented and patient cooperative  Airway & Oxygen Therapy: Patient Spontanous Breathing and Patient connected to nasal cannula oxygen  Post-op Assessment: Report given to RN, Post -op Vital signs reviewed and stable and Patient moving all extremities  Post vital signs: Reviewed and stable  Last Vitals:  Vitals Value Taken Time  BP 123/72 12/18/2017  1:45 PM  Temp    Pulse 97 12/18/2017  1:49 PM  Resp 16 12/18/2017  1:49 PM  SpO2 96 % 12/18/2017  1:49 PM  Vitals shown include unvalidated device data.  Last Pain:  Vitals:   12/18/17 1345  TempSrc:   PainSc: (P) Asleep      Patients Stated Pain Goal: 3 (12/18/17 0854)  Complications: No apparent anesthesia complications

## 2017-12-18 NOTE — Anesthesia Preprocedure Evaluation (Addendum)
Anesthesia Evaluation  Patient identified by MRN, date of birth, ID band Patient awake    Reviewed: Allergy & Precautions, NPO status , Patient's Chart, lab work & pertinent test results  Airway Mallampati: II  TM Distance: >3 FB     Dental   Pulmonary pneumonia, COPD, Current Smoker,    breath sounds clear to auscultation       Cardiovascular + CAD and + Peripheral Vascular Disease   Rhythm:Regular Rate:Normal     Neuro/Psych    GI/Hepatic Neg liver ROS, hiatal hernia, GERD  ,  Endo/Other  diabetes  Renal/GU negative Renal ROS     Musculoskeletal   Abdominal   Peds  Hematology   Anesthesia Other Findings   Reproductive/Obstetrics                            Anesthesia Physical Anesthesia Plan  ASA: III  Anesthesia Plan: General   Post-op Pain Management:    Induction: Intravenous  PONV Risk Score and Plan: 2 and Treatment may vary due to age or medical condition, Ondansetron, Dexamethasone and Midazolam  Airway Management Planned: Oral ETT  Additional Equipment:   Intra-op Plan:   Post-operative Plan: Possible Post-op intubation/ventilation  Informed Consent: I have reviewed the patients History and Physical, chart, labs and discussed the procedure including the risks, benefits and alternatives for the proposed anesthesia with the patient or authorized representative who has indicated his/her understanding and acceptance.   Dental advisory given  Plan Discussed with: CRNA and Anesthesiologist  Anesthesia Plan Comments:         Anesthesia Quick Evaluation

## 2017-12-18 NOTE — Progress Notes (Addendum)
Writer assessed golf ball size hematoma around incision site of neck. Pt c/o of neck and head hurting. Pt is visibly sweating. Temperature checked and WNL. Lelon MastSamantha, PA notified and made aware who notified Dr. Darrick PennaFields. Writer also notified and made Dr. Chilton SiGreen aware of event. Will continue to monitor pt closely.

## 2017-12-18 NOTE — Progress Notes (Signed)
Patient admitted from OR S/P Carotid Endarectomy/ hematoma evacuation. Incision site is dry with old blood with JP drain. Patient appears very drowsy and responds to touch and voice. Transferring nurse said patient has been like this and has been getting frequent IV pain medications from patient complaining of pain on incision site. Patient also has not been able to per transferring nurse. Patient has  A-line and BP readings systolic is still in th 160's. Telemetry applied and CCMD called. Patient asked for a bed pan, this nurse will provide a bed pan.

## 2017-12-18 NOTE — Progress Notes (Signed)
S/p retrograde right carotid stent earlier today with hematoma that needs to be evacuated.  Discussed with patient and she is OK to proceed.   WElls L-3 CommunicationsBRabham

## 2017-12-18 NOTE — Anesthesia Procedure Notes (Signed)
Arterial Line Insertion Start/End8/11/2017 9:30 AM, 12/18/2017 9:44 AM Performed by: Lucinda Dellecarlo, Paxtyn Boyar M, CRNA, CRNA  Patient location: Pre-op. Preanesthetic checklist: patient identified, IV checked, site marked, risks and benefits discussed, surgical consent, monitors and equipment checked and pre-op evaluation Lidocaine 1% used for infiltration Left, radial was placed Catheter size: 20 G Hand hygiene performed  and maximum sterile barriers used  Allen's test indicative of satisfactory collateral circulation Attempts: 1 Procedure performed without using ultrasound guided technique. Following insertion, dressing applied and Biopatch. Post procedure assessment: normal  Patient tolerated the procedure well with no immediate complications.

## 2017-12-18 NOTE — Anesthesia Postprocedure Evaluation (Signed)
Anesthesia Post Note  Patient: Isabella [name]  Procedure(s) Performed: INSERTION OF RETROGRADE CAROTID STENT (Right Neck)     Patient location during evaluation: PACU Anesthesia Type: General Level of consciousness: awake Pain management: pain level controlled Vital Signs Assessment: post-procedure vital signs reviewed and stable Respiratory status: spontaneous breathing Cardiovascular status: stable Postop Assessment: no headache Anesthetic complications: no    Last Vitals:  Vitals:   12/18/17 1645 12/18/17 1700  BP: (!) 141/64 138/66  Pulse: 76 86  Resp: 12 12  Temp:    SpO2: 95% 94%    Last Pain:  Vitals:   12/18/17 1700  TempSrc:   PainSc: Asleep                 Precious Gilchrest

## 2017-12-18 NOTE — Progress Notes (Signed)
Hand off report given to [facility], CRNA. Pt's vital signs stable. Pt taken back to OR at this time.

## 2017-12-18 NOTE — Anesthesia Procedure Notes (Signed)
Procedure Name: Intubation Date/Time: 12/18/2017 6:10 PM Performed by: Myna Bright, CRNA Pre-anesthesia Checklist: Patient identified, Emergency Drugs available, Suction available and Patient being monitored Patient Re-evaluated:Patient Re-evaluated prior to induction Oxygen Delivery Method: Circle system utilized Preoxygenation: Pre-oxygenation with 100% oxygen Induction Type: IV induction Ventilation: Mask ventilation without difficulty Laryngoscope Size: Mac and 3 Grade View: Grade I Tube type: Oral Tube size: 7.0 mm Number of attempts: 1 Airway Equipment and Method: Stylet Placement Confirmation: ETT inserted through vocal cords under direct vision,  positive ETCO2 and breath sounds checked- equal and bilateral Secured at: 21 cm Tube secured with: Tape Dental Injury: Teeth and Oropharynx as per pre-operative assessment

## 2017-12-18 NOTE — Transfer of Care (Signed)
Immediate Anesthesia Transfer of Care Note  Patient: Isabella MoutonLisa Rae [name]  Procedure(s) Performed: EVACUATION HEMATOMA (Right Neck)  Patient Location: PACU  Anesthesia Type:General  Level of Consciousness: awake, alert  and oriented  Airway & Oxygen Therapy: Patient connected to nasal cannula oxygen  Post-op Assessment: Report given to RN and Post -op Vital signs reviewed and stable  Post vital signs: Reviewed and stable  Last Vitals:  Vitals Value Taken Time  BP 152/88 12/18/2017  7:02 PM  Temp    Pulse 90 12/18/2017  7:03 PM  Resp 11 12/18/2017  7:03 PM  SpO2 98 % 12/18/2017  7:03 PM  Vitals shown include unvalidated device data.  Last Pain:  Vitals:   12/18/17 1745  TempSrc:   PainSc: Asleep      Patients Stated Pain Goal: 3 (12/18/17 1654)  Complications: No apparent anesthesia complications

## 2017-12-19 ENCOUNTER — Encounter (HOSPITAL_COMMUNITY): Payer: Self-pay | Admitting: Surgery

## 2017-12-19 ENCOUNTER — Other Ambulatory Visit: Payer: Self-pay

## 2017-12-19 LAB — BASIC METABOLIC PANEL
ANION GAP: 11 (ref 5–15)
BUN: 16 mg/dL (ref 6–20)
CALCIUM: 8.8 mg/dL — AB (ref 8.9–10.3)
CO2: 25 mmol/L (ref 22–32)
Chloride: 102 mmol/L (ref 98–111)
Creatinine, Ser: 0.69 mg/dL (ref 0.44–1.00)
Glucose, Bld: 180 mg/dL — ABNORMAL HIGH (ref 70–99)
Potassium: 4.3 mmol/L (ref 3.5–5.1)
SODIUM: 138 mmol/L (ref 135–145)

## 2017-12-19 LAB — CBC
HCT: 38.2 % (ref 36.0–46.0)
Hemoglobin: 12.2 g/dL (ref 12.0–15.0)
MCH: 30 pg (ref 26.0–34.0)
MCHC: 31.9 g/dL (ref 30.0–36.0)
MCV: 93.9 fL (ref 78.0–100.0)
PLATELETS: 306 10*3/uL (ref 150–400)
RBC: 4.07 MIL/uL (ref 3.87–5.11)
RDW: 13.5 % (ref 11.5–15.5)
WBC: 14.6 10*3/uL — AB (ref 4.0–10.5)

## 2017-12-19 LAB — GLUCOSE, CAPILLARY
Glucose-Capillary: 176 mg/dL — ABNORMAL HIGH (ref 70–99)
Glucose-Capillary: 171 mg/dL — ABNORMAL HIGH (ref 70–99)

## 2017-12-19 MED ORDER — OXYCODONE-ACETAMINOPHEN 5-325 MG PO TABS: 1.0000 | ORAL_TABLET | Freq: Four times a day (QID) | ORAL | 0 refills | Status: DC | PRN

## 2017-12-19 NOTE — Plan of Care (Signed)
  Problem: Clinical Measurements: Goal: Ability to maintain clinical measurements within normal limits will improve Outcome: Progressing Goal: Postoperative complications will be avoided or minimized Outcome: Progressing   Problem: Elimination: Goal: Will not experience complications related to urinary retention Outcome: Progressing

## 2017-12-19 NOTE — Progress Notes (Signed)
A-line d/c after AM lab. Patient tolerated well. Pressure held. Pulses present. Catheter tip intact. Will continue to monitor site.

## 2017-12-19 NOTE — Progress Notes (Signed)
[facility] [place]  Subjective  - no complaints   Objective (!) 133/56 72 97.6 F (36.4 C) (Oral) 18 94%  Intake/Output Summary (Last 24 hours) at 12/19/2017 [id number] Last data filed at 12/19/2017 [id number] Gross per 24 hour  Intake 1500 ml  Output 2095 ml  Net -595 ml   Neuro swallow intact symmetric face arm leg strength  Assessment/Planning:  S/p left CCA stent  Used a lot of IV morphine last pm Transition to oral meds 20 percocet for home no refills  D/c home today D/c drain   Isabella [name] 12/19/2017 8:12 AM --  Laboratory Lab Results: Recent Labs    12/19/17 0500  WBC 14.6*  HGB 12.2  HCT 38.2  PLT 306   BMET Recent Labs    12/19/17 0500  NA 138  K 4.3  CL 102  CO2 25  GLUCOSE 180*  BUN 16  CREATININE 0.69  CALCIUM 8.8*    COAG Lab Results  Component Value Date   INR 1.00 12/11/2017   INR 0.91 06/20/2017   No results found for: PTT

## 2017-12-19 NOTE — Discharge Summary (Signed)
Discharge Summary     Isabella [name] 1965-10-29 52 y.o. female  [id number]  Admission Date: 12/18/2017  Discharge Date: 12/19/17  Physician: Sherren Kerns, MD  Admission Diagnosis: right carotid stenosis   HPI:   This is a 52 y.o. female  who returns for follow-up today.  She has previously had a innominate artery bypass in [place].  She recently had a left carotid endarterectomy done by my partner Dr. Edilia Bo.  She has also previously had a right carotid endarterectomy.  She was noted on duplex scan and on recent arteriogram to have stenosis of the proximal right common carotid artery.  She was offered stenting of this in April of this year but decided for conservative management.  She now returns more anxious about the narrowing in her right carotid artery and complains about right arm numbness.  She occasionally gets some dizziness if she tilts her head back.  She does not describe any right cortical symptoms such as weakness numbness or facial droop.  She is intolerant to statins due to muscle aches.  She is currently on aspirin and Plavix.  She has a family history of early atherosclerosis and multiple family members.  Hospital Course:  The patient was admitted to the hospital and taken to the operating room on 12/18/2017 and underwent Exposure of right common carotid artery, right common carotid stent placement (7 x 29 VBX).    Operative findings: 90% proximal common carotid artery stenosis stented to 0% residual stenosis 7 x 29 VBX.    The pt tolerated the procedure well and was transported to the PACU in good condition.  In the recovery room, she started developing an hematoma in her neck.  Her breathing was non labored and she was hemodynamically stable.  She was taken back to the operating room for evacuation of hematoma and drain placement.   By POD 1, the pt neuro status was in tact.  Her drain was removed and she is discharged home.   The remainder of the hospital  course consisted of increasing mobilization and increasing intake of solids without difficulty.   Recent Labs    12/19/17 0500  NA 138  K 4.3  CL 102  CO2 25  GLUCOSE 180*  BUN 16  CALCIUM 8.8*   Recent Labs    12/19/17 0500  WBC 14.6*  HGB 12.2  HCT 38.2  PLT 306   No results for input(s): INR in the last 72 hours.   Discharge Instructions    Discharge patient   Complete by:  As directed    Discharge disposition:  01-Home or Self Care   Discharge patient date:  12/19/2017      Discharge Diagnosis:  right carotid stenosis  Secondary Diagnosis: Patient Active Problem List   Diagnosis Date Noted  . Asymptomatic carotid artery stenosis without infarction, right 12/18/2017  . Carotid stenosis 06/25/2017   Past Medical History:  Diagnosis Date  . Anemia   . Arthritis    "lower back, knees, elbows"  . Complication of anesthesia 2013   Patient stated that she could not  "wake up from anesthesia and coded" during aorta surgery in [facility]; by account likely post-op aspiration with respiratory distress/reintubation 11/2011  . COPD (chronic obstructive pulmonary disease) (HCC)    patient states she has been diagnosed with COPD "but does not use any inhalers"  . Coronary artery disease   . Diabetes mellitus without complication (HCC)    Type II  . Fatty liver   .  GERD (gastroesophageal reflux disease)    diet controlled  . History of hiatal hernia   . History of kidney stones   . Peripheral vascular disease (HCC)   . Pneumonia   . Poison sumac    acquired it approx on Friday.  On back, below left shoulder and under breasts  . Stroke [facility](HCC)     Allergies as of 12/19/2017      Reactions   Statins Other (See Comments)   Muscle weakness Arm Pain      Medication List    TAKE these medications   amoxicillin 500 MG capsule Commonly known as:  AMOXIL Take 2,000 mg by mouth See admin instructions. Take 4 capsules (2000 mg) by mouth 1 hour prior to dental  procedures.   aspirin 81 MG chewable tablet Chew 81 mg by mouth daily.   clopidogrel 75 MG tablet Commonly known as:  PLAVIX Take 1 tablet (75 mg total) by mouth daily.   LANTUS SOLOSTAR 100 UNIT/ML Solostar Pen Generic drug:  Insulin Glargine Inject 32 Units into the skin at bedtime.   oxyCODONE-acetaminophen 5-325 MG tablet Commonly known as:  PERCOCET/ROXICET Take 1 tablet by mouth every 6 (six) hours as needed for moderate pain.   PROAIR RESPICLICK 108 (90 Base) MCG/ACT Aepb Generic drug:  Albuterol Sulfate Inhale 2 puffs into the lungs every 6 (six) hours as needed (For wheezing/shortness of breath.).   varenicline 0.5 MG tablet Commonly known as:  CHANTIX Take 1 tablet (0.5 mg total) by mouth 2 (two) times daily.        [facility] Discharge Instructions Carotid Endarterectomy (CEA)  Please refer to the following instructions for your post-procedure care. Your surgeon or physician assistant will discuss any changes with you.  Activity  You are encouraged to walk as much as you can. You can slowly return to normal activities but must avoid strenuous activity and heavy lifting until your doctor tell you it's OK. Avoid activities such as vacuuming or swinging a golf club. You can drive after one week if you are comfortable and you are no longer taking prescription pain medications. It is normal to feel tired for serval weeks after your surgery. It is also normal to have difficulty with sleep habits, eating, and bowel movements after surgery. These will go away with time.  Bathing/Showering  You may shower after you come home. Do not soak in a bathtub, hot tub, or swim until the incision heals completely.  Incision Care  Shower every day. Clean your incision with mild soap and water. Pat the area dry with a clean towel. You do not need a bandage unless otherwise instructed. Do not apply any ointments or creams to your incision. You may  have skin glue on your incision. Do not peel it off. It will come off on its own in about one week. Your incision may feel thickened and raised for several weeks after your surgery. This is normal and the skin will soften over time. For Men Only: It's OK to shave around the incision but do not shave the incision itself for 2 weeks. It is common to have numbness under your chin that could last for several months.  Diet  Resume your normal diet. There are no special food restrictions following this procedure. A low fat/low cholesterol diet is recommended for all patients with vascular disease. In order to heal from your surgery, it is CRITICAL to get adequate nutrition. Your body requires vitamins, minerals, and  protein. Vegetables are the best source of vitamins and minerals. Vegetables also provide the perfect balance of protein. Processed food has little nutritional value, so try to avoid this.  Medications  Resume taking all of your medications unless your doctor or physician assistant tells you not to.  If your incision is causing pain, you may take over-the- counter pain relievers such as acetaminophen (Tylenol). If you were prescribed a stronger pain medication, please be aware these medications can cause nausea and constipation.  Prevent nausea by taking the medication with a snack or meal. Avoid constipation by drinking plenty of fluids and eating foods with a high amount of fiber, such as fruits, vegetables, and grains.  Do not take Tylenol if you are taking prescription pain medications.  Follow Up  Our office will schedule a follow up appointment 2-3 weeks following discharge.  Please call us immediately for any of the following conditions  . Increased pain, redness, drainage (pus) from your incision site. . Fever of 101 degrees or higher. . If you should develop stroke (slurred speech, difficulty swallowing, weakness on one side of your body, loss of vision) you should call 911 and go  to the nearest emergency room. .  Reduce your risk of vascular disease:  . Stop smoking. If you would like help call [facility] at 1-800-QUIT-NOW (517-216-8806) or [place] at 929-780-4529. . Manage your cholesterol . Maintain a desired weight . Control your diabetes . Keep your blood pressure down .  If you have any questions, please call the office at [phone].  Prescriptions given: Roxicet #20 No Refill  Disposition: home  Patient's condition: is Good  Follow up: 1. Dr. Darrick Penna in 4 weeks with carotid duplex   Doreatha Massed, PA-C [facility] [phone]   --- For [facility] use ---   Modified Rankin score at D/C (0-6): 0  IV medication needed for:  1. Hypertension: No 2. Hypotension: No  Post-op Complications: No  1. Post-op CVA or TIA: No  If yes: Event classification (right eye, left eye, right cortical, left cortical, verterobasilar, other): n/a  If yes: Timing of event (intra-op, <6 hrs post-op, >=6 hrs post-op, unknown): n/a  2. CN injury: No  If yes: CN n/a injuried   3. Myocardial infarction: No  If yes: Dx by (EKG or clinical, Troponin): n/a  4.  CHF: No  5.  Dysrhythmia (new): No  6. Wound infection: No  7. Reperfusion symptoms: No  8. Return to OR: Yes  If yes: return to OR for (bleeding, neurologic, other CEA incision, other): hematoma  Discharge medications: Statin use:  No allergy ASA use:  Yes   Beta blocker use:  No ACE-Inhibitor use:  No  ARB use:  No CCB use: No P2Y12 Antagonist use: Yes, [x ] Plavix, [ ]  Plasugrel, [ ]  Ticlopinine, [ ]  Ticagrelor, [ ]  Other, [ ]  No for medical reason, [ ]  Non-compliant, [ ]  Not-indicated Anti-coagulant use:  No, [ ]  Warfarin, [ ]  Rivaroxaban, [ ]  Dabigatran,

## 2017-12-19 NOTE — Discharge Instructions (Signed)
   [facility] [place]  Discharge Instructions   Carotid Surgery  Please refer to the following instructions for your post-procedure care. Your surgeon or physician assistant will discuss any changes with you.  Activity  You are encouraged to walk as much as you can. You can slowly return to normal activities but must avoid strenuous activity and heavy lifting until your doctor tell you it's okay. Avoid activities such as vacuuming or swinging a golf club. You can drive after one week if you are comfortable and you are no longer taking prescription pain medications. It is normal to feel tired for serval weeks after your surgery. It is also normal to have difficulty with sleep habits, eating, and bowel movements after surgery. These will go away with time.  Bathing/Showering  Shower daily after you go home. Do not soak in a bathtub, hot tub, or swim until the incision heals completely.  Incision Care  Shower every day. Clean your incision with mild soap and water. Pat the area dry with a clean towel. You do not need a bandage unless otherwise instructed. Do not apply any ointments or creams to your incision. You may have skin glue on your incision. Do not peel it off. It will come off on its own in about one week. Your incision may feel thickened and raised for several weeks after your surgery. This is normal and the skin will soften over time.   For Men Only: It's okay to shave around the incision but do not shave the incision itself for 2 weeks. It is common to have numbness under your chin that could last for several months.  Diet  Resume your normal diet. There are no special food restrictions following this procedure. A low fat/low cholesterol diet is recommended for all patients with vascular disease. In order to heal from your surgery, it is CRITICAL to get adequate nutrition. Your body requires vitamins, minerals, and protein. Vegetables are the best source of  vitamins and minerals. Vegetables also provide the perfect balance of protein. Processed food has little nutritional value, so try to avoid this.  Medications  Resume taking all of your medications unless your doctor or physician assistant tells you not to. If your incision is causing pain, you may take over-the- counter pain relievers such as acetaminophen (Tylenol). If you were prescribed a stronger pain medication, please be aware these medications can cause nausea and constipation. Prevent nausea by taking the medication with a snack or meal. Avoid constipation by drinking plenty of fluids and eating foods with a high amount of fiber, such as fruits, vegetables, and grains.   Do not take Tylenol if you are taking prescription pain medications.  Follow Up  Our office will schedule a follow up appointment 2-3 weeks following discharge.  Please call us immediately for any of the following conditions  . Increased pain, redness, drainage (pus) from your incision site. . Fever of 101 degrees or higher. . If you should develop stroke (slurred speech, difficulty swallowing, weakness on one side of your body, loss of vision) you should call 911 and go to the nearest emergency room. .  Reduce your risk of vascular disease:  . Stop smoking. If you would like help call [facility] at 1-800-QUIT-NOW (1-800-784-8669) or  at 336-586-4000. . Manage your cholesterol . Maintain a desired weight . Control your diabetes . Keep your blood pressure down .  If you have any questions, please call the office at [phone]. 

## 2017-12-19 NOTE — Progress Notes (Signed)
JP drain removed from mid-upper chest w/o difficulty.  5 cc blood in drain.  Scant drainage noted upon removal.  Dry dressing applied.

## 2017-12-23 ENCOUNTER — Telehealth: Payer: Self-pay | Admitting: Vascular Surgery

## 2017-12-23 ENCOUNTER — Telehealth: Payer: Self-pay | Admitting: *Deleted

## 2017-12-23 NOTE — Telephone Encounter (Signed)
Patient called and stated she is having "some" drainage from incision and wanted antibiotic called in. Informed her I would give her appointment to have incision checked. She stated she was not going to drive in , "too stressful" She denies redness or heat at incision, no fever or chills. Instructed to go to Er for acute changes or call for appt to be checked if worsening condition.

## 2017-12-23 NOTE — Telephone Encounter (Signed)
sch appt spk to pt 01/30/18 3pm carotid 345pm f/u MD

## 2017-12-24 ENCOUNTER — Encounter (HOSPITAL_COMMUNITY): Payer: Self-pay | Admitting: Vascular Surgery

## 2017-12-24 ENCOUNTER — Other Ambulatory Visit: Payer: Self-pay

## 2017-12-24 DIAGNOSIS — I6521 Occlusion and stenosis of right carotid artery: Secondary | ICD-10-CM

## 2017-12-24 DIAGNOSIS — I6522 Occlusion and stenosis of left carotid artery: Secondary | ICD-10-CM

## 2018-01-16 ENCOUNTER — Ambulatory Visit: Payer: Medicare PPO | Admitting: Vascular Surgery

## 2018-01-30 ENCOUNTER — Inpatient Hospital Stay: Admission: RE | Admit: 2018-01-30 | Payer: Medicare PPO | Source: Ambulatory Visit

## 2018-01-30 ENCOUNTER — Encounter (HOSPITAL_COMMUNITY): Payer: Medicare PPO

## 2018-01-30 ENCOUNTER — Encounter: Payer: Medicare PPO | Admitting: Vascular Surgery

## 2018-02-12 ENCOUNTER — Ambulatory Visit: Payer: Medicare PPO | Admitting: Vascular Surgery

## 2018-02-12 ENCOUNTER — Encounter (HOSPITAL_COMMUNITY): Payer: Medicare PPO

## 2018-03-27 ENCOUNTER — Encounter: Payer: Self-pay | Admitting: Vascular Surgery

## 2018-03-27 ENCOUNTER — Ambulatory Visit (HOSPITAL_COMMUNITY)
Admission: RE | Admit: 2018-03-27 | Discharge: 2018-03-27 | Disposition: A | Discharge status: Home / Self Care | Payer: Medicare PPO | Source: Ambulatory Visit | Attending: Vascular Surgery | Admitting: Vascular Surgery

## 2018-03-27 ENCOUNTER — Other Ambulatory Visit: Payer: Self-pay

## 2018-03-27 ENCOUNTER — Ambulatory Visit (INDEPENDENT_AMBULATORY_CARE_PROVIDER_SITE_OTHER): Payer: Medicare PPO | Admitting: Vascular Surgery

## 2018-03-27 VITALS — BP 164/94 | HR 93 | Temp 97.6°F | Resp 16 | Ht 62.0 in | Wt 138.0 lb

## 2018-03-27 DIAGNOSIS — I6521 Occlusion and stenosis of right carotid artery: Secondary | ICD-10-CM

## 2018-03-27 DIAGNOSIS — I6523 Occlusion and stenosis of bilateral carotid arteries: Secondary | ICD-10-CM

## 2018-03-27 DIAGNOSIS — I6522 Occlusion and stenosis of left carotid artery: Secondary | ICD-10-CM | POA: Diagnosis present

## 2018-03-27 NOTE — Progress Notes (Signed)
Patient is a 52 year old female who returns for follow-up today.  She recently underwent common carotid stenting on the right side to retrograde approach August 2019.  She had previously undergone left carotid endarterectomy by Dr. Edilia Boickson February 2019.  She denies having any TIA symptoms.  She does complain of some numbness and tingling in her right arm that she has when she awakens from sleep but this sounds more positional.  She has also previously had a right carotid endarterectomy and on innominate bypass elsewhere.  Unfortunately she continues to smoke.  She was counseled against this again today.  She is on aspirin and Plavix.  Of note she recently cut off the tips of 2 fingers on her left hand and have these repaired with sutures in [place].  She apparently has follow-up scheduled with a hand surgeon in the near future.  Current Outpatient Medications on File Prior to Visit  Medication Sig Dispense Refill  . Albuterol Sulfate (PROAIR RESPICLICK) 108 (90 Base) MCG/ACT AEPB Inhale 2 puffs into the lungs every 6 (six) hours as needed (For wheezing/shortness of breath.).    Marland Kitchen. amoxicillin (AMOXIL) 500 MG capsule Take 2,000 mg by mouth See admin instructions. Take 4 capsules (2000 mg) by mouth 1 hour prior to dental procedures.  2  . aspirin 81 MG chewable tablet Chew 81 mg by mouth daily.    . cephALEXin (KEFLEX) 500 MG capsule TK 1 C PO Q 6 H FOR 10 DAYS  0  . clopidogrel (PLAVIX) 75 MG tablet Take 1 tablet (75 mg total) by mouth daily. 30 tablet 2  . Insulin Glargine (LANTUS SOLOSTAR) 100 UNIT/ML Solostar Pen Inject 32 Units into the skin at bedtime.    . meloxicam (MOBIC) 15 MG tablet TK 1 T PO QD WF  0  . oxyCODONE-acetaminophen (PERCOCET/ROXICET) 5-325 MG tablet Take 1 tablet by mouth every 6 (six) hours as needed for moderate pain. 20 tablet 0  . varenicline (CHANTIX) 0.5 MG tablet Take 1 tablet (0.5 mg total) by mouth 2 (two) times daily. 30 tablet 6   No current facility-administered  medications on file prior to visit.     Past Medical History:  Diagnosis Date  . Anemia   . Arthritis    "lower back, knees, elbows"  . Complication of anesthesia 2013   Patient stated that she could not  "wake up from anesthesia and coded" [place] [facility] [place]; by account likely post-op aspiration with respiratory distress/reintubation 11/2011  . COPD (chronic obstructive pulmonary disease) (HCC)    patient states she has been diagnosed with COPD "but does not use any inhalers"  . Coronary artery disease   . Diabetes mellitus without complication (HCC)    Type II  . Fatty liver   . GERD (gastroesophageal reflux disease)    diet controlled  . History of hiatal hernia   . History of kidney stones   . Peripheral vascular disease (HCC)   . Pneumonia   . Poison sumac    acquired it approx on Friday.  On back, below left shoulder and under breasts  . Stroke [facility](HCC)     Past Surgical History:  Procedure Laterality Date  . ABDOMINAL AORTOGRAM W/LOWER EXTREMITY N/A 08/16/2017   Procedure: ABDOMINAL AORTOGRAM W/LOWER EXTREMITY;  Surgeon: Chuck Hintickson, Christopher S, MD;  [facility];  Service: Cardiovascular;  Laterality: N/A;  . ABDOMINAL HYSTERECTOMY    . AORTA SURGERY     with sent placement  . AORTIC ARCH ANGIOGRAPHY  08/16/2017   Procedure: AORTIC ARCH ANGIOGRAPHY/ INNOMINTE BYPASS ANGIOGRAPHY;  Surgeon: Chuck Hint, MD;  Location: [facility] INVASIVE CV LAB;  Service: Cardiovascular;;  . CAROTID ENDARTERECTOMY Right   . CYSTOCELE REPAIR    . DILATION AND CURETTAGE OF UTERUS    . ENDARTERECTOMY Left 06/25/2017   Procedure: LEFT CAROTID ENDARTERECTOMY;  Surgeon: Chuck Hint, MD;  Location: [facility] OR;  Service: Vascular;  Laterality: Left;  . HEMATOMA EVACUATION Right 12/18/2017   Procedure: EVACUATION HEMATOMA;  Surgeon: Nada Libman, MD;  Location: [facility] OR;  Service: Vascular;  Laterality: Right;  . INSERTION OF RETROGRADE CAROTID STENT Right  12/18/2017   Procedure: INSERTION OF RETROGRADE CAROTID STENT;  Surgeon: Sherren Kerns, MD;  Location: [facility] OR;  Service: Vascular;  Laterality: Right;  . PATCH ANGIOPLASTY Left 06/25/2017   Procedure: PATCH ANGIOPLASTY OF LEFT CAROTID ARTERY USING HEMASHIELD PALTINUM FINESSE PATCH;  Surgeon: Chuck Hint, MD;  Location: [facility] OR;  Service: Vascular;  Laterality: Left;  . RECTOCELE REPAIR     x2     Physical exam:  Vitals:   03/27/18 1602  BP: (!) 164/94  Pulse: 93  Resp: 16  Temp: 97.6 F (36.4 C)  TempSrc: Oral  SpO2: 97%  Weight: 138 lb (62.6 kg)  Height: 5\' 2"  (1.575 m)    Neck: No carotid bruits  Chest: Clear to auscultation bilaterally  Cardiac: Regular rate and rhythm  Extremities: Suture repair of digits 2 and 3 distal phalanx skin maceration and edema of both digits no erythema no purulent drainage  Vascular: 2+ left radial pulse  Data: Patient had a duplex ultrasound of her carotids today.  I reviewed and interpreted the study.  This showed patent left and right carotid endarterectomies with 40 to 60% stenosis bilaterally.  Right common carotid stent was patent although possibly some narrowing of the proximal aspect although grayscale images did not really show this.  Assessment: Doing well status post recent right common carotid stent with prior right innominate bypass and bilateral carotid endarterectomy.  Follow-up carotid duplex scan in 6 months.  If there is further narrowing of the proximal aspect of the stent consider CT Angio of neck and chest  Left hand injury has follow-up scheduled with hand surgery in the near future  Tobacco abuse patient will try to continue to quit smoking  Plan: See above  Fabienne Bruns, MD [facility] [place] Office: [phone] Pager: [phone]

## 2018-04-16 DIAGNOSIS — S61213A Laceration without foreign body of left middle finger without damage to nail, initial encounter: Secondary | ICD-10-CM | POA: Insufficient documentation

## 2018-04-16 DIAGNOSIS — S62633B Displaced fracture of distal phalanx of left middle finger, initial encounter for open fracture: Secondary | ICD-10-CM | POA: Insufficient documentation

## 2018-05-08 ENCOUNTER — Other Ambulatory Visit: Payer: Self-pay

## 2018-05-08 MED ORDER — CLOPIDOGREL BISULFATE 75 MG PO TABS: 75.0000 mg | ORAL_TABLET | Freq: Every day | ORAL | 2 refills | Status: DC

## 2019-04-02 ENCOUNTER — Other Ambulatory Visit: Payer: Self-pay | Admitting: Vascular Surgery

## 2019-09-21 ENCOUNTER — Other Ambulatory Visit: Payer: Self-pay | Admitting: *Deleted

## 2019-09-21 DIAGNOSIS — I6523 Occlusion and stenosis of bilateral carotid arteries: Secondary | ICD-10-CM

## 2019-09-23 ENCOUNTER — Ambulatory Visit: Payer: Medicare PPO

## 2019-09-23 ENCOUNTER — Encounter (HOSPITAL_COMMUNITY): Payer: Medicare PPO

## 2019-10-29 ENCOUNTER — Encounter (HOSPITAL_COMMUNITY): Payer: Medicare PPO

## 2019-10-29 ENCOUNTER — Ambulatory Visit: Payer: Medicare PPO

## 2019-11-30 ENCOUNTER — Other Ambulatory Visit: Payer: Self-pay

## 2019-11-30 DIAGNOSIS — I6523 Occlusion and stenosis of bilateral carotid arteries: Secondary | ICD-10-CM

## 2019-12-09 ENCOUNTER — Encounter (HOSPITAL_COMMUNITY): Payer: Medicare PPO

## 2019-12-09 ENCOUNTER — Ambulatory Visit: Payer: Medicare PPO

## 2019-12-22 ENCOUNTER — Other Ambulatory Visit: Payer: Self-pay

## 2019-12-22 DIAGNOSIS — I6523 Occlusion and stenosis of bilateral carotid arteries: Secondary | ICD-10-CM

## 2020-01-06 ENCOUNTER — Other Ambulatory Visit: Payer: Self-pay | Admitting: *Deleted

## 2020-01-06 ENCOUNTER — Ambulatory Visit (HOSPITAL_COMMUNITY)
Admission: RE | Admit: 2020-01-06 | Discharge: 2020-01-06 | Disposition: A | Discharge status: Home / Self Care | Payer: Medicare PPO | Source: Ambulatory Visit | Attending: Vascular Surgery | Admitting: Vascular Surgery

## 2020-01-06 ENCOUNTER — Other Ambulatory Visit: Payer: Self-pay

## 2020-01-06 ENCOUNTER — Ambulatory Visit: Payer: Medicare PPO | Admitting: Physician Assistant

## 2020-01-06 VITALS — BP 139/81 | HR 82 | Temp 98.3°F | Resp 20 | Ht 62.0 in | Wt 141.0 lb

## 2020-01-06 DIAGNOSIS — I6523 Occlusion and stenosis of bilateral carotid arteries: Secondary | ICD-10-CM

## 2020-01-06 DIAGNOSIS — Z720 Tobacco use: Secondary | ICD-10-CM

## 2020-01-06 NOTE — Progress Notes (Signed)
Established Carotid Patient   History of Present Illness   Isabella [name] is a 54 y.o. (May 30, 1965) female who presents to go over carotid artery stenosis.  She has an extensive surgical history including innominate artery bypass and [place] as well as right carotid endarterectomy in [place].  She is also had left carotid endarterectomy by Dr. Edilia Bo.  Most recently in 2019 she had a right carotid cutdown with proximal common carotid artery stenting by Dr. Darrick Penna.  She had been lost to follow-up however presents today with a carotid duplex.  She denies any recent diagnosis of CVA or TIA.  She also denies any strokelike symptoms including slurring speech, changes in vision, or one-sided weakness.  She continues to take her aspirin and Plavix daily.  She is an everyday smoker.  The patient's PMH, PSH, SH, and FamHx were reviewed and are unchanged from prior visit.  Current Outpatient Medications  Medication Sig Dispense Refill  . Albuterol Sulfate (PROAIR RESPICLICK) 108 (90 Base) MCG/ACT AEPB Inhale 2 puffs into the lungs every 6 (six) hours as needed (For wheezing/shortness of breath.).    Marland Kitchen amoxicillin (AMOXIL) 500 MG capsule Take 2,000 mg by mouth See admin instructions. Take 4 capsules (2000 mg) by mouth 1 hour prior to dental procedures.  2  . aspirin 81 MG chewable tablet Chew 81 mg by mouth daily.    . cephALEXin (KEFLEX) 500 MG capsule TK 1 C PO Q 6 H FOR 10 DAYS  0  . clopidogrel (PLAVIX) 75 MG tablet TAKE 1 TABLET BY MOUTH EVERY DAY 90 tablet 2  . Fingerstix Lancets MISC Inject into the skin.    Marland Kitchen glucose blood (PRECISION QID TEST) test strip 1 strip by Other route every day    . Insulin Glargine (LANTUS SOLOSTAR) 100 UNIT/ML Solostar Pen Inject 32 Units into the skin at bedtime.    . meloxicam (MOBIC) 15 MG tablet TK 1 T PO QD WF  0  . oxyCODONE-acetaminophen (PERCOCET/ROXICET) 5-325 MG tablet Take 1 tablet by mouth every 6 (six) hours as needed for moderate pain. 20 tablet 0  .  varenicline (CHANTIX) 0.5 MG tablet Take 1 tablet (0.5 mg total) by mouth 2 (two) times daily. 30 tablet 6   No current facility-administered medications for this visit.    REVIEW OF SYSTEMS (negative unless checked):   Cardiac:  []  Chest pain or chest pressure? []  Shortness of breath upon activity? []  Shortness of breath when lying flat? []  Irregular heart rhythm?  Vascular:  []  Pain in calf, thigh, or hip brought on by walking? []  Pain in feet at night that wakes you up from your sleep? []  Blood clot in your veins? []  Leg swelling?  Pulmonary:  []  Oxygen at home? []  Productive cough? []  Wheezing?  Neurologic:  []  Sudden weakness in arms or legs? []  Sudden numbness in arms or legs? []  Sudden onset of difficult speaking or slurred speech? []  Temporary loss of vision in one eye? []  Problems with dizziness?  Gastrointestinal:  []  Blood in stool? []  Vomited blood?  Genitourinary:  []  Burning when urinating? []  Blood in urine?  Psychiatric:  []  Major depression  Hematologic:  []  Bleeding problems? []  Problems with blood clotting?  Dermatologic:  []  Rashes or ulcers?  Constitutional:  []  Fever or chills?  Ear/Nose/Throat:  []  Change in hearing? []  Nose bleeds? []  Sore throat?  Musculoskeletal:  []  Back pain? []  Joint pain? []  Muscle pain?   Physical Examination   Vitals:  01/06/20 1309  BP: 139/81  Pulse: 82  Resp: 20  Temp: 98.3 F (36.8 C)  TempSrc: Temporal  SpO2: 100%  Weight: 141 lb (64 kg)  Height: 5\' 2"  (1.575 m)   Body mass index is 25.79 kg/m.  General:  WDWN in NAD; vital signs documented above Gait: Not observed HENT: WNL, normocephalic Pulmonary: normal non-labored breathing , without Rales, rhonchi,  wheezing Cardiac: regular HR Abdomen: soft, NT, no masses Skin: without rashes Extremities: without ischemic changes, without Gangrene , without cellulitis; without open wounds;  Musculoskeletal: no muscle wasting or  atrophy  Neurologic: A&O X 3;  No focal weakness or paresthesias are detected; cranial nerves grossly intact Psychiatric:  The pt has Normal affect.  Non-Invasive Vascular Imaging   B Carotid Duplex :   R ICA stenosis:  80-99%  R CCA stent 50-75% throughout  L ICA stenosis:  40-59%  L VA:  patent and antegrade   Medical Decision Making   Isabella [name] is a 54 y.o. female who presents for carotid artery surveillance  -Carotid duplex today demonstrates critical stenosis of 80 to 99% of proximal right ICA -Duplex also demonstrates moderate to severe stenosis throughout right CCA stent -Patient is asymptomatic without any strokelike symptoms currently -Plan will be for CTA of the head and neck as soon as possible with follow-up with Dr. 59 to discuss revascularization -The patient understands and agrees with the plan above   Darrick Penna, PA-C [facility] [phone] 01/06/2020  1:48 PM

## 2020-01-07 ENCOUNTER — Other Ambulatory Visit: Payer: Self-pay | Admitting: *Deleted

## 2020-01-07 DIAGNOSIS — I6523 Occlusion and stenosis of bilateral carotid arteries: Secondary | ICD-10-CM

## 2020-01-08 ENCOUNTER — Other Ambulatory Visit: Payer: Self-pay

## 2020-01-08 DIAGNOSIS — I6523 Occlusion and stenosis of bilateral carotid arteries: Secondary | ICD-10-CM

## 2020-01-11 ENCOUNTER — Ambulatory Visit (HOSPITAL_COMMUNITY): Payer: Medicare PPO

## 2020-01-12 ENCOUNTER — Encounter (HOSPITAL_COMMUNITY): Payer: Self-pay

## 2020-01-12 ENCOUNTER — Other Ambulatory Visit: Payer: Self-pay

## 2020-01-12 ENCOUNTER — Ambulatory Visit (HOSPITAL_COMMUNITY)
Admission: RE | Admit: 2020-01-12 | Discharge: 2020-01-12 | Disposition: A | Discharge status: Home / Self Care | Payer: Medicare PPO | Source: Ambulatory Visit | Attending: Physician Assistant | Admitting: Physician Assistant

## 2020-01-12 DIAGNOSIS — I6523 Occlusion and stenosis of bilateral carotid arteries: Secondary | ICD-10-CM

## 2020-01-12 LAB — POCT I-STAT CREATININE: Creatinine, Ser: 0.6 mg/dL (ref 0.44–1.00)

## 2020-01-12 MED ORDER — IOHEXOL 350 MG/ML SOLN
100.0000 mL | Freq: Once | INTRAVENOUS | Status: AC | PRN
  Administered 2020-01-12: 100 mL via INTRAVENOUS

## 2020-01-12 MED ORDER — SODIUM CHLORIDE (PF) 0.9 % IJ SOLN
INTRAMUSCULAR | Status: AC
  Filled 2020-01-12: qty 50

## 2020-01-14 ENCOUNTER — Ambulatory Visit: Payer: Medicare PPO | Admitting: Vascular Surgery

## 2020-01-14 ENCOUNTER — Encounter: Payer: Self-pay | Admitting: Vascular Surgery

## 2020-01-14 ENCOUNTER — Other Ambulatory Visit: Payer: Self-pay

## 2020-01-14 VITALS — BP 121/80 | HR 95 | Temp 98.1°F | Resp 20 | Ht 62.0 in | Wt 140.9 lb

## 2020-01-14 DIAGNOSIS — I6521 Occlusion and stenosis of right carotid artery: Secondary | ICD-10-CM

## 2020-01-14 NOTE — Progress Notes (Signed)
Patient is a 54 year old female who presents for follow-up today after recent CT angio of the head and neck for asymptomatic right carotid stenosis.  She has previously had multiple revascularizations of her cerebral circulation.  She had an innominate bypass in [place] many years ago.  She has also had a right carotid endarterectomy in [place] many years ago.  She had a left carotid endarterectomy by partner Dr. Durwin Nora several years ago.  Most recently in 2019 she had a right common carotid stent placed.  She has not had any symptoms of TIA amaurosis or stroke recently.  She is on aspirin and Plavix.  She is unable to take statins due to myalgias.  Unfortunately she continues to smoke heavily.  She was counseled against this again today.  Other chronic medical problems include diabetes COPD both of which have been stable.  Family History  Problem Relation Age of Onset  . AAA (abdominal aortic aneurysm) Sister   . Peripheral Artery Disease Brother      Past Medical History:  Diagnosis Date  . Anemia   . Arthritis    "lower back, knees, elbows"  . Complication of anesthesia 2013   Patient stated that she could not  "wake up from anesthesia and coded" during aorta surgery in [facility]; by account likely post-op aspiration with respiratory distress/reintubation 11/2011  . COPD (chronic obstructive pulmonary disease) (HCC)    patient states she has been diagnosed with COPD "but does not use any inhalers"  . Coronary artery disease   . Diabetes mellitus without complication (HCC)    Type II  . Fatty liver   . GERD (gastroesophageal reflux disease)    diet controlled  . History of hiatal hernia   . History of kidney stones   . Peripheral vascular disease (HCC)   . Pneumonia   . Poison sumac    acquired it approx on Friday.  On back, below left shoulder and under breasts  . Stroke [facility])     Past Surgical History:  Procedure Laterality Date  . ABDOMINAL AORTOGRAM W/LOWER EXTREMITY N/A  08/16/2017   Procedure: ABDOMINAL AORTOGRAM W/LOWER EXTREMITY;  Surgeon: Chuck Hint, MD;  Location: [facility] INVASIVE CV LAB;  Service: Cardiovascular;  Laterality: N/A;  . ABDOMINAL HYSTERECTOMY    . AORTA SURGERY     with sent placement  . AORTIC ARCH ANGIOGRAPHY  08/16/2017   Procedure: AORTIC ARCH ANGIOGRAPHY/ INNOMINTE BYPASS ANGIOGRAPHY;  Surgeon: Chuck Hint, MD;  Location: [facility] INVASIVE CV LAB;  Service: Cardiovascular;;  . CAROTID ENDARTERECTOMY Right   . CYSTOCELE REPAIR    . DILATION AND CURETTAGE OF UTERUS    . ENDARTERECTOMY Left 06/25/2017   Procedure: LEFT CAROTID ENDARTERECTOMY;  Surgeon: Chuck Hint, MD;  Location: [facility] OR;  Service: Vascular;  Laterality: Left;  . HEMATOMA EVACUATION Right 12/18/2017   Procedure: EVACUATION HEMATOMA;  Surgeon: Nada Libman, MD;  Location: [facility] OR;  Service: Vascular;  Laterality: Right;  . INSERTION OF RETROGRADE CAROTID STENT Right 12/18/2017   Procedure: INSERTION OF RETROGRADE CAROTID STENT;  Surgeon: Sherren Kerns, MD;  Location: [facility] OR;  Service: Vascular;  Laterality: Right;  . PATCH ANGIOPLASTY Left 06/25/2017   Procedure: PATCH ANGIOPLASTY OF LEFT CAROTID ARTERY USING HEMASHIELD PALTINUM FINESSE PATCH;  Surgeon: Chuck Hint, MD;  Location: [facility] OR;  Service: Vascular;  Laterality: Left;  . RECTOCELE REPAIR     x2    Current Outpatient Medications on File Prior to Visit  Medication Sig Dispense  Refill  . Albuterol Sulfate (PROAIR RESPICLICK) 108 (90 Base) MCG/ACT AEPB Inhale 2 puffs into the lungs every 6 (six) hours as needed (For wheezing/shortness of breath.).    Marland Kitchen amoxicillin (AMOXIL) 500 MG capsule Take 2,000 mg by mouth See admin instructions. Take 4 capsules (2000 mg) by mouth 1 hour prior to dental procedures.  2  . cephALEXin (KEFLEX) 500 MG capsule TK 1 C PO Q 6 H FOR 10 DAYS  0  . clopidogrel (PLAVIX) 75 MG tablet TAKE 1 TABLET BY MOUTH EVERY DAY 90 tablet 2  . Fingerstix Lancets MISC Inject  into the skin.    Marland Kitchen glucose blood (PRECISION QID TEST) test strip 1 strip by Other route every day    . Insulin Glargine (LANTUS SOLOSTAR) 100 UNIT/ML Solostar Pen Inject 32 Units into the skin at bedtime.    . meloxicam (MOBIC) 15 MG tablet TK 1 T PO QD WF  0  . oxyCODONE-acetaminophen (PERCOCET/ROXICET) 5-325 MG tablet Take 1 tablet by mouth every 6 (six) hours as needed for moderate pain. 20 tablet 0  . varenicline (CHANTIX) 0.5 MG tablet Take 1 tablet (0.5 mg total) by mouth 2 (two) times daily. 30 tablet 6  . aspirin 81 MG chewable tablet Chew 81 mg by mouth daily. (Patient not taking: Reported on 01/14/2020)     No current facility-administered medications on file prior to visit.    Allergies  Allergen Reactions  . Statins Other (See Comments)    Muscle weakness Arm Pain    Physical exam:  Vitals:   01/14/20 1312  BP: 121/80  Pulse: 95  Resp: 20  Temp: 98.1 F (36.7 C)  SpO2: 96%  Weight: 140 lb 14.4 oz (63.9 kg)  Height: 5\' 2"  (1.575 m)    Neck: Well-healed right and left neck scars and sternotomy scar  Neuro: Symmetric upper extremity and lower extremity motor strength 5/5 no facial asymmetry  Data: Patient had a CT angio of the head and neck earlier this week which I reviewed today.  This shows greater than 90% right internal carotid artery stenosis.  The right common carotid stent is patent.  This is well below the clavicle.  Left carotid circulation is patent.  No significant intracranial stenosis.  I reviewed and interpreted these films.  Assessment: Recurrent right internal carotid artery stenosis post prior right carotid endarterectomy  Plan: Right TCAR stent tentatively scheduled for February 15, 2020.  Patient will need cardiac risk ratification prior to the procedure.  Risk benefits possible complications and procedure details of TCAR were explained to the patient today include but not limited to bleeding infection stroke risk possible cranial nerve injury.  She  understands and agrees to proceed.  February 17, 2020, MD [facility] [place] Office: [phone]

## 2020-01-21 ENCOUNTER — Other Ambulatory Visit: Payer: Self-pay | Admitting: *Deleted

## 2020-01-21 ENCOUNTER — Telehealth: Payer: Self-pay

## 2020-01-21 DIAGNOSIS — I6521 Occlusion and stenosis of right carotid artery: Secondary | ICD-10-CM

## 2020-01-21 NOTE — Telephone Encounter (Signed)
Pt called to inquire if there was an alternate way to screen for covid. She does not want a nasal swab performed. We discussed this is the only screening that is being used at this time and she is going to think about it and will call back if she wants to schedule her surgery for next month.

## 2020-01-27 ENCOUNTER — Other Ambulatory Visit: Payer: Self-pay

## 2020-02-01 ENCOUNTER — Telehealth: Payer: Self-pay

## 2020-02-01 NOTE — Telephone Encounter (Signed)
Spoke with pt regarding arrival time change to 5:30AM for surgery at [facility] on 02/29/20. Pt verbalized understanding.

## 2020-02-15 ENCOUNTER — Telehealth: Payer: Self-pay

## 2020-02-15 NOTE — Telephone Encounter (Signed)
Patient requested to cancel TCAR surgery on 02/29/20 until things change with pandemic. Provider and [employer] rep informed.

## 2020-02-22 ENCOUNTER — Inpatient Hospital Stay (HOSPITAL_COMMUNITY): Admission: RE | Admit: 2020-02-22 | Payer: Medicare PPO | Source: Ambulatory Visit

## 2020-02-25 ENCOUNTER — Other Ambulatory Visit (HOSPITAL_COMMUNITY): Payer: Medicare PPO

## 2020-02-29 ENCOUNTER — Inpatient Hospital Stay: Admit: 2020-02-29 | Payer: Medicare PPO | Admitting: Vascular Surgery

## 2020-02-29 SURGERY — TRANSCAROTID ARTERY REVASCULARIZATION (TCAR)
Anesthesia: General | Laterality: Right

## 2020-03-28 ENCOUNTER — Other Ambulatory Visit: Payer: Self-pay

## 2020-04-13 ENCOUNTER — Ambulatory Visit: Payer: Medicare PPO | Admitting: Cardiology

## 2020-04-13 NOTE — Progress Notes (Deleted)
Cardiology Office Note:    Date:  04/13/2020   ID:  Isabella [name], DOB June 03, 1965, MRN [id number]  PCP:  Patient, No Pcp Per  Cardiologist:  No primary care provider on file.  Referring MD: Sherren Kerns, MD   No chief complaint on file.   History of Present Illness:    Isabella [name] is a 54 y.o. female with a hx of CVA, carotid stenosis and multiple cerebral revascularizations, type II diabetes, tobacco abuse, statin intolerance, COPD who is seen as a new consult at the request of Sherren Kerns, MD for the evaluation and management of preoperative cardiovascular evaluation.  Planned surgery: right transcarotid artery revascularization, Dr. Darrick Penna, 06/03/20  Pertinent past cardiac history: Prior cardiac workup: none available in our system or Care Everywhere. History of valve disease: History of CAD/PAD/CVA/TIA: History of heart failure: History of arrhythmia: On anticoagulation: History of hypertension History of diabetes: History of CKD:  History of OSA: History of anesthesia complications: Current symptoms: Functional capacity:  Past Medical History:  Diagnosis Date  . Anemia   . Arthritis    "lower back, knees, elbows"  . Complication of anesthesia 2013   Patient stated that she could not  "wake up from anesthesia and coded" during aorta surgery in [facility]; by account likely post-op aspiration with respiratory distress/reintubation 11/2011  . COPD (chronic obstructive pulmonary disease) (HCC)    patient states she has been diagnosed with COPD "but does not use any inhalers"  . Coronary artery disease   . Diabetes mellitus without complication (HCC)    Type II  . Fatty liver   . GERD (gastroesophageal reflux disease)    diet controlled  . History of hiatal hernia   . History of kidney stones   . Peripheral vascular disease (HCC)   . Pneumonia   . Poison sumac    acquired it approx on Friday.  On back, below left shoulder and under breasts  .  Stroke [facility])     Past Surgical History:  Procedure Laterality Date  . ABDOMINAL AORTOGRAM W/LOWER EXTREMITY N/A 08/16/2017   Procedure: ABDOMINAL AORTOGRAM W/LOWER EXTREMITY;  Surgeon: Chuck Hint, MD;  Location: [facility] INVASIVE CV LAB;  Service: Cardiovascular;  Laterality: N/A;  . ABDOMINAL HYSTERECTOMY    . AORTA SURGERY     with sent placement  . AORTIC ARCH ANGIOGRAPHY  08/16/2017   Procedure: AORTIC ARCH ANGIOGRAPHY/ INNOMINTE BYPASS ANGIOGRAPHY;  Surgeon: Chuck Hint, MD;  Location: [facility] INVASIVE CV LAB;  Service: Cardiovascular;;  . CAROTID ENDARTERECTOMY Right   . CYSTOCELE REPAIR    . DILATION AND CURETTAGE OF UTERUS    . ENDARTERECTOMY Left 06/25/2017   Procedure: LEFT CAROTID ENDARTERECTOMY;  Surgeon: Chuck Hint, MD;  Location: [facility] OR;  Service: Vascular;  Laterality: Left;  . HEMATOMA EVACUATION Right 12/18/2017   Procedure: EVACUATION HEMATOMA;  Surgeon: Nada Libman, MD;  Location: [facility] OR;  Service: Vascular;  Laterality: Right;  . INSERTION OF RETROGRADE CAROTID STENT Right 12/18/2017   Procedure: INSERTION OF RETROGRADE CAROTID STENT;  Surgeon: Sherren Kerns, MD;  Location: [facility] OR;  Service: Vascular;  Laterality: Right;  . PATCH ANGIOPLASTY Left 06/25/2017   Procedure: PATCH ANGIOPLASTY OF LEFT CAROTID ARTERY USING HEMASHIELD PALTINUM FINESSE PATCH;  Surgeon: Chuck Hint, MD;  Location: [facility] OR;  Service: Vascular;  Laterality: Left;  . RECTOCELE REPAIR     x2    Current Medications: Current Outpatient Medications on File Prior to Visit  Medication  Sig  . Albuterol Sulfate (PROAIR RESPICLICK) 108 (90 Base) MCG/ACT AEPB Inhale 2 puffs into the lungs every 6 (six) hours as needed (For wheezing/shortness of breath.). (Patient not taking: Reported on 02/10/2020)  . amoxicillin (AMOXIL) 500 MG capsule Take 2,000 mg by mouth See admin instructions. Take 4 capsules (2000 mg) by mouth 1 hour prior to dental procedures.  Marland Kitchen aspirin 81 MG chewable  tablet Chew 81 mg by mouth daily.   . cephALEXin (KEFLEX) 500 MG capsule TK 1 C PO Q 6 H FOR 10 DAYS (Patient not taking: Reported on 02/10/2020)  . clopidogrel (PLAVIX) 75 MG tablet TAKE 1 TABLET BY MOUTH EVERY DAY (Patient taking differently: Take 75 mg by mouth daily. )  . Fingerstix Lancets MISC Inject into the skin.  Marland Kitchen glucose blood (PRECISION QID TEST) test strip 1 strip by Other route every day  . Insulin Glargine (LANTUS SOLOSTAR) 100 UNIT/ML Solostar Pen Inject 32 Units into the skin at bedtime.  . meloxicam (MOBIC) 15 MG tablet TK 1 T PO QD WF (Patient not taking: Reported on 02/10/2020)  . oxyCODONE-acetaminophen (PERCOCET/ROXICET) 5-325 MG tablet Take 1 tablet by mouth every 6 (six) hours as needed for moderate pain. (Patient not taking: Reported on 02/10/2020)  . varenicline (CHANTIX) 0.5 MG tablet Take 1 tablet (0.5 mg total) by mouth 2 (two) times daily. (Patient not taking: Reported on 02/10/2020)   No current facility-administered medications on file prior to visit.     Allergies:   Statins   Social History   Tobacco Use  . Smoking status: Current Every Day Smoker    Packs/day: 1.50    Types: Cigarettes  . Smokeless tobacco: Never Used  Vaping Use  . Vaping Use: Never used  Substance Use Topics  . Alcohol use: No  . Drug use: No    Family History: family history includes AAA (abdominal aortic aneurysm) in her sister; Peripheral Artery Disease in her brother.  ROS:   Please see the history of present illness.  Additional pertinent ROS: Constitutional: Negative for chills, fever, night sweats, unintentional weight loss  HENT: Negative for ear pain and hearing loss.   Eyes: Negative for loss of vision and eye pain.  Respiratory: Negative for cough, sputum, wheezing.   Cardiovascular: See HPI. Gastrointestinal: Negative for abdominal pain, melena, and hematochezia.  Genitourinary: Negative for dysuria and hematuria.  Musculoskeletal: Negative for falls and myalgias.   Skin: Negative for itching and rash.  Neurological: Negative for focal weakness, focal sensory changes and loss of consciousness.  Endo/Heme/Allergies: Does not bruise/bleed easily.     EKGs/Labs/Other Studies Reviewed:    The following studies were reviewed today: ***  EKG:  EKG is personally reviewed.  The ekg ordered today demonstrates ***  Recent Labs: 01/12/2020: Creatinine, Ser 0.60  Recent Lipid Panel No results found for: CHOL, TRIG, HDL, CHOLHDL, VLDL, LDLCALC, LDLDIRECT  Physical Exam:    VS:  There were no vitals taken for this visit.    Wt Readings from Last 3 Encounters:  01/14/20 140 lb 14.4 oz (63.9 kg)  01/06/20 141 lb (64 kg)  03/27/18 138 lb (62.6 kg)    GEN: Well nourished, well developed in no acute distress HEENT: Normal, moist mucous membranes NECK: No JVD CARDIAC: regular rhythm, normal S1 and S2, no rubs or gallops. No murmurs. VASCULAR: Radial and DP pulses 2+ bilaterally. No carotid bruits RESPIRATORY:  Clear to auscultation without rales, wheezing or rhonchi  ABDOMEN: Soft, non-tender, non-distended MUSCULOSKELETAL:  Ambulates independently SKIN:  Warm and dry, no edema NEUROLOGIC:  Alert and oriented x 3. No focal neuro deficits noted. PSYCHIATRIC:  Normal affect    ASSESSMENT:    No diagnosis found. PLAN:     Cardiac risk counseling and prevention recommendations: -recommend heart healthy/Mediterranean diet, with whole grains, fruits, vegetable, fish, lean meats, nuts, and olive oil. Limit salt. -recommend moderate walking, 3-5 times/week for 30-50 minutes each session. Aim for at least 150 minutes.week. Goal should be pace of 3 miles/hours, or walking 1.5 miles in 30 minutes -recommend avoidance of tobacco products. Avoid excess alcohol. -Additional risk factor control:  -Diabetes risk: A1c is  -Lipids:   -Blood pressure control:  -Weight:  -ASCVD risk score: The 10-year ASCVD risk score Denman George DC Montez Hageman., et al., 2013) is: 15.6%    Values used to calculate the score:     Age: 84 years     Sex: Female     Is Non-Hispanic African American: No     Diabetic: Yes     Tobacco smoker: Yes     Systolic Blood Pressure: 110 mmHg     Is BP treated: No     HDL Cholesterol: 28 MG/DL     Total Cholesterol: 241 MG/DL    Plan for follow up:  Jodelle Red, MD, PhD [place]  [facility]    Medication Adjustments/Labs and Tests Ordered: Current medicines are reviewed at length with the patient today.  Concerns regarding medicines are outlined above.  No orders of the defined types were placed in this encounter.  No orders of the defined types were placed in this encounter.   There are no Patient Instructions on file for this visit.  Signed, Jodelle Red, MD PhD 04/13/2020 8:43 AM    [facility]

## 2020-05-31 ENCOUNTER — Ambulatory Visit: Payer: Medicare PPO | Admitting: Cardiology

## 2020-06-02 ENCOUNTER — Ambulatory Visit: Payer: Medicare PPO | Admitting: Vascular Surgery

## 2020-06-02 ENCOUNTER — Inpatient Hospital Stay (HOSPITAL_COMMUNITY): Admission: RE | Admit: 2020-06-02 | Payer: Medicare PPO | Source: Ambulatory Visit

## 2020-06-02 ENCOUNTER — Encounter (HOSPITAL_COMMUNITY): Payer: Medicare PPO

## 2020-06-03 ENCOUNTER — Encounter (HOSPITAL_COMMUNITY): Admission: RE | Payer: Self-pay | Source: Home / Self Care

## 2020-06-03 ENCOUNTER — Inpatient Hospital Stay (HOSPITAL_COMMUNITY): Admission: RE | Admit: 2020-06-03 | Payer: Medicare PPO | Source: Home / Self Care | Admitting: Vascular Surgery

## 2020-06-03 SURGERY — TRANSCAROTID ARTERY REVASCULARIZATION (TCAR)
Anesthesia: General | Laterality: Right

## 2020-06-19 NOTE — Progress Notes (Incomplete)
Cardiology Office Note:    Date:  06/19/2020   ID:  Isabella [name], DOB Dec 20, 1965, MRN [id number]  PCP:  Patient, No Pcp Per  Cardiologist:  No primary care provider on file.  Referring MD: Dr. Darrick Penna  No chief complaint on file.   History of Present Illness:    Isabella [name] is a 55 y.o. female with a hx of CVA, carotid stenosis and multiple cerebral revascularizations, type II diabetes, tobacco abuse, statin intolerance, COPD who is seen as a new consult at the request of Dr. Darrick Penna for the evaluation and management of preoperative cardiovascular risk assessment.  Planned surgery: right transcarotid artery revascularization, date TBD, Dr. Darrick Penna  Pertinent past cardiac history: Prior cardiac workup: none available in our system or Care Everywhere History of valve disease: History of CAD/PAD/CVA/TIA: History of heart failure: History of arrhythmia: On anticoagulation: History of hypertension History of diabetes: History of CKD:  History of OSA: History of anesthesia complications: Current symptoms: Functional capacity:  Past Medical History:  Diagnosis Date  . Anemia   . Arthritis    "lower back, knees, elbows"  . Complication of anesthesia 2013   Patient stated that she could not  "wake up from anesthesia and coded" during aorta surgery in [facility]; by account likely post-op aspiration with respiratory distress/reintubation 11/2011  . COPD (chronic obstructive pulmonary disease) (HCC)    patient states she has been diagnosed with COPD "but does not use any inhalers"  . Coronary artery disease   . Diabetes mellitus without complication (HCC)    Type II  . Fatty liver   . GERD (gastroesophageal reflux disease)    diet controlled  . History of hiatal hernia   . History of kidney stones   . Peripheral vascular disease (HCC)   . Pneumonia   . Poison sumac    acquired it approx on Friday.  On back, below left shoulder and under breasts  . Stroke [facility])      Past Surgical History:  Procedure Laterality Date  . ABDOMINAL AORTOGRAM W/LOWER EXTREMITY N/A 08/16/2017   Procedure: ABDOMINAL AORTOGRAM W/LOWER EXTREMITY;  Surgeon: Chuck Hint, MD;  Location: [facility] INVASIVE CV LAB;  Service: Cardiovascular;  Laterality: N/A;  . ABDOMINAL HYSTERECTOMY    . AORTA SURGERY     with sent placement  . AORTIC ARCH ANGIOGRAPHY  08/16/2017   Procedure: AORTIC ARCH ANGIOGRAPHY/ INNOMINTE BYPASS ANGIOGRAPHY;  Surgeon: Chuck Hint, MD;  Location: [facility] INVASIVE CV LAB;  Service: Cardiovascular;;  . CAROTID ENDARTERECTOMY Right   . CYSTOCELE REPAIR    . DILATION AND CURETTAGE OF UTERUS    . ENDARTERECTOMY Left 06/25/2017   Procedure: LEFT CAROTID ENDARTERECTOMY;  Surgeon: Chuck Hint, MD;  Location: [facility] OR;  Service: Vascular;  Laterality: Left;  . HEMATOMA EVACUATION Right 12/18/2017   Procedure: EVACUATION HEMATOMA;  Surgeon: Nada Libman, MD;  Location: [facility] OR;  Service: Vascular;  Laterality: Right;  . INSERTION OF RETROGRADE CAROTID STENT Right 12/18/2017   Procedure: INSERTION OF RETROGRADE CAROTID STENT;  Surgeon: Sherren Kerns, MD;  Location: [facility] OR;  Service: Vascular;  Laterality: Right;  . PATCH ANGIOPLASTY Left 06/25/2017   Procedure: PATCH ANGIOPLASTY OF LEFT CAROTID ARTERY USING HEMASHIELD PALTINUM FINESSE PATCH;  Surgeon: Chuck Hint, MD;  Location: [facility] OR;  Service: Vascular;  Laterality: Left;  . RECTOCELE REPAIR     x2    Current Medications: Current Outpatient Medications on File Prior to Visit  Medication Sig  .  Albuterol Sulfate (PROAIR RESPICLICK) 108 (90 Base) MCG/ACT AEPB Inhale 2 puffs into the lungs every 6 (six) hours as needed (For wheezing/shortness of breath.). (Patient not taking: Reported on 02/10/2020)  . amoxicillin (AMOXIL) 500 MG capsule Take 2,000 mg by mouth See admin instructions. Take 4 capsules (2000 mg) by mouth 1 hour prior to dental procedures.  Marland Kitchen aspirin 81 MG chewable tablet Chew  81 mg by mouth daily.   . cephALEXin (KEFLEX) 500 MG capsule TK 1 C PO Q 6 H FOR 10 DAYS (Patient not taking: Reported on 02/10/2020)  . clopidogrel (PLAVIX) 75 MG tablet TAKE 1 TABLET BY MOUTH EVERY DAY (Patient taking differently: Take 75 mg by mouth daily. )  . Fingerstix Lancets MISC Inject into the skin.  Marland Kitchen glucose blood (PRECISION QID TEST) test strip 1 strip by Other route every day  . Insulin Glargine (LANTUS SOLOSTAR) 100 UNIT/ML Solostar Pen Inject 32 Units into the skin at bedtime.  . meloxicam (MOBIC) 15 MG tablet TK 1 T PO QD WF (Patient not taking: Reported on 02/10/2020)  . oxyCODONE-acetaminophen (PERCOCET/ROXICET) 5-325 MG tablet Take 1 tablet by mouth every 6 (six) hours as needed for moderate pain. (Patient not taking: Reported on 02/10/2020)  . varenicline (CHANTIX) 0.5 MG tablet Take 1 tablet (0.5 mg total) by mouth 2 (two) times daily. (Patient not taking: Reported on 02/10/2020)   No current facility-administered medications on file prior to visit.     Allergies:   Statins   Social History   Tobacco Use  . Smoking status: Current Every Day Smoker    Packs/day: 1.50    Types: Cigarettes  . Smokeless tobacco: Never Used  Vaping Use  . Vaping Use: Never used  Substance Use Topics  . Alcohol use: No  . Drug use: No    Family History: family history includes AAA (abdominal aortic aneurysm) in her sister; Peripheral Artery Disease in her brother.  ROS:   Please see the history of present illness.  Additional pertinent ROS: Constitutional: Negative for chills, fever, night sweats, unintentional weight loss  HENT: Negative for ear pain and hearing loss.   Eyes: Negative for loss of vision and eye pain.  Respiratory: Negative for cough, sputum, wheezing.   Cardiovascular: See HPI. Gastrointestinal: Negative for abdominal pain, melena, and hematochezia.  Genitourinary: Negative for dysuria and hematuria.  Musculoskeletal: Negative for falls and myalgias.  Skin:  Negative for itching and rash.  Neurological: Negative for focal weakness, focal sensory changes and loss of consciousness.  Endo/Heme/Allergies: Does not bruise/bleed easily.     EKGs/Labs/Other Studies Reviewed:    The following studies were reviewed today: ***  EKG:  EKG is personally reviewed.  The ekg ordered today demonstrates ***  Recent Labs: 01/12/2020: Creatinine, Ser 0.60  Recent Lipid Panel No results found for: CHOL, TRIG, HDL, CHOLHDL, VLDL, LDLCALC, LDLDIRECT  Physical Exam:    VS:  There were no vitals taken for this visit.    Wt Readings from Last 3 Encounters:  01/14/20 140 lb 14.4 oz (63.9 kg)  01/06/20 141 lb (64 kg)  03/27/18 138 lb (62.6 kg)    GEN: Well nourished, well developed in no acute distress HEENT: Normal, moist mucous membranes NECK: No JVD CARDIAC: regular rhythm, normal S1 and S2, no rubs or gallops. No murmur. VASCULAR: Radial and DP pulses 2+ bilaterally. No carotid bruits RESPIRATORY:  Clear to auscultation without rales, wheezing or rhonchi  ABDOMEN: Soft, non-tender, non-distended MUSCULOSKELETAL:  Ambulates independently SKIN: Warm and dry,  no edema NEUROLOGIC:  Alert and oriented x 3. No focal neuro deficits noted. PSYCHIATRIC:  Normal affect    ASSESSMENT:    No diagnosis found. PLAN:     Cardiac risk counseling and prevention recommendations: -recommend heart healthy/Mediterranean diet, with whole grains, fruits, vegetable, fish, lean meats, nuts, and olive oil. Limit salt. -recommend moderate walking, 3-5 times/week for 30-50 minutes each session. Aim for at least 150 minutes.week. Goal should be pace of 3 miles/hours, or walking 1.5 miles in 30 minutes -recommend avoidance of tobacco products. Avoid excess alcohol. -Additional risk factor control:  -Diabetes risk: A1c is  -Lipids:   -Blood pressure control:  -Weight:  -ASCVD risk score: The 10-year ASCVD risk score Denman George DC Montez Hageman., et al., 2013) is: 15.6%   Values used to  calculate the score:     Age: 3 years     Sex: Female     Is Non-Hispanic African American: No     Diabetic: Yes     Tobacco smoker: Yes     Systolic Blood Pressure: 110 mmHg     Is BP treated: No     HDL Cholesterol: 28 MG/DL     Total Cholesterol: 241 MG/DL    Plan for follow up:  Jodelle Red, MD, PhD, [facility] [place]  [facility]    Medication Adjustments/Labs and Tests Ordered: Current medicines are reviewed at length with the patient today.  Concerns regarding medicines are outlined above.  No orders of the defined types were placed in this encounter.  No orders of the defined types were placed in this encounter.   There are no Patient Instructions on file for this visit.  Signed, Jodelle Red, MD PhD 06/19/2020 4:50 PM    [facility]

## 2020-06-20 ENCOUNTER — Ambulatory Visit: Payer: Medicare PPO | Admitting: Cardiology

## 2020-06-30 ENCOUNTER — Ambulatory Visit: Payer: Medicare PPO | Admitting: Vascular Surgery

## 2020-07-07 ENCOUNTER — Other Ambulatory Visit: Payer: Self-pay

## 2020-07-07 ENCOUNTER — Encounter: Payer: Self-pay | Admitting: Vascular Surgery

## 2020-07-07 ENCOUNTER — Ambulatory Visit: Payer: Medicare PPO | Admitting: Vascular Surgery

## 2020-07-07 ENCOUNTER — Ambulatory Visit (HOSPITAL_COMMUNITY)
Admission: RE | Admit: 2020-07-07 | Discharge: 2020-07-07 | Disposition: A | Discharge status: Home / Self Care | Payer: Medicare PPO | Source: Ambulatory Visit | Attending: Vascular Surgery | Admitting: Vascular Surgery

## 2020-07-07 VITALS — BP 148/75 | HR 87 | Temp 98.1°F | Resp 20 | Ht 62.0 in | Wt 135.0 lb

## 2020-07-07 DIAGNOSIS — I6521 Occlusion and stenosis of right carotid artery: Secondary | ICD-10-CM

## 2020-07-07 NOTE — Progress Notes (Signed)
Patient is a 55 year old female who returns for follow-up today.  She was last seen September 2021.  At that point we were planning to place a right TCAR stent.  Due to several obstacles regarding Covid testing and patient's reluctance to proceed with operation this was delayed.  This was for an asymptomatic 90% right internal carotid artery stenosis.  She has had a previous right common carotid stent.  This is well below the clavicle.  This was placed in 2019.  Left carotid circulation is patent.  No significant intracranial stenosis.  It was all based on a CT angiogram from September 2021.  She previously had an innominate bypass in [place] many years ago.  She also had a right carotid endarterectomy in [place] many years ago.  She had a left carotid endarterectomy by my partner Dr. Edilia Bo several years ago.  She is on Plavix and aspirin.  She is unable to take statins due to myalgias.  Past Medical History:  Diagnosis Date  . Anemia   . Arthritis    "lower back, knees, elbows"  . Complication of anesthesia 2013   Patient stated that she could not  "wake up from anesthesia and coded" during aorta surgery in [facility]; by account likely post-op aspiration with respiratory distress/reintubation 11/2011  . COPD (chronic obstructive pulmonary disease) (HCC)    patient states she has been diagnosed with COPD "but does not use any inhalers"  . Coronary artery disease   . Diabetes mellitus without complication (HCC)    Type II  . Fatty liver   . GERD (gastroesophageal reflux disease)    diet controlled  . History of hiatal hernia   . History of kidney stones   . Peripheral vascular disease (HCC)   . Pneumonia   . Poison sumac    acquired it approx on Friday.  On back, below left shoulder and under breasts  . Stroke [facility])     Past Surgical History:  Procedure Laterality Date  . ABDOMINAL AORTOGRAM W/LOWER EXTREMITY N/A 08/16/2017   Procedure: ABDOMINAL AORTOGRAM W/LOWER EXTREMITY;   Surgeon: Chuck Hint, MD;  Location: [facility] INVASIVE CV LAB;  Service: Cardiovascular;  Laterality: N/A;  . ABDOMINAL HYSTERECTOMY    . AORTA SURGERY     with sent placement  . AORTIC ARCH ANGIOGRAPHY  08/16/2017   Procedure: AORTIC ARCH ANGIOGRAPHY/ INNOMINTE BYPASS ANGIOGRAPHY;  Surgeon: Chuck Hint, MD;  Location: [facility] INVASIVE CV LAB;  Service: Cardiovascular;;  . CAROTID ENDARTERECTOMY Right   . CYSTOCELE REPAIR    . DILATION AND CURETTAGE OF UTERUS    . ENDARTERECTOMY Left 06/25/2017   Procedure: LEFT CAROTID ENDARTERECTOMY;  Surgeon: Chuck Hint, MD;  Location: [facility] OR;  Service: Vascular;  Laterality: Left;  . HEMATOMA EVACUATION Right 12/18/2017   Procedure: EVACUATION HEMATOMA;  Surgeon: Nada Libman, MD;  Location: [facility] OR;  Service: Vascular;  Laterality: Right;  . INSERTION OF RETROGRADE CAROTID STENT Right 12/18/2017   Procedure: INSERTION OF RETROGRADE CAROTID STENT;  Surgeon: Sherren Kerns, MD;  Location: [facility] OR;  Service: Vascular;  Laterality: Right;  . PATCH ANGIOPLASTY Left 06/25/2017   Procedure: PATCH ANGIOPLASTY OF LEFT CAROTID ARTERY USING HEMASHIELD PALTINUM FINESSE PATCH;  Surgeon: Chuck Hint, MD;  Location: [facility] OR;  Service: Vascular;  Laterality: Left;  . RECTOCELE REPAIR     x2    Current Outpatient Medications on File Prior to Visit  Medication Sig Dispense Refill  . Albuterol Sulfate (PROAIR RESPICLICK) 108 (90 Base)  MCG/ACT AEPB Inhale 2 puffs into the lungs every 6 (six) hours as needed (For wheezing/shortness of breath.).    Marland Kitchen amoxicillin (AMOXIL) 500 MG capsule Take 2,000 mg by mouth See admin instructions. Take 4 capsules (2000 mg) by mouth 1 hour prior to dental procedures.  2  . aspirin 81 MG chewable tablet Chew 81 mg by mouth daily.     . cephALEXin (KEFLEX) 500 MG capsule TK 1 C PO Q 6 H FOR 10 DAYS  0  . clopidogrel (PLAVIX) 75 MG tablet TAKE 1 TABLET BY MOUTH EVERY DAY (Patient taking differently: Take 75 mg by  mouth daily.) 90 tablet 2  . Fingerstix Lancets MISC Inject into the skin.    Marland Kitchen glucose blood (PRECISION QID TEST) test strip 1 strip by Other route every day    . insulin glargine (LANTUS) 100 UNIT/ML Solostar Pen Inject 32 Units into the skin at bedtime.    . meloxicam (MOBIC) 15 MG tablet TK 1 T PO QD WF  0  . oxyCODONE-acetaminophen (PERCOCET/ROXICET) 5-325 MG tablet Take 1 tablet by mouth every 6 (six) hours as needed for moderate pain. 20 tablet 0  . varenicline (CHANTIX) 0.5 MG tablet Take 1 tablet (0.5 mg total) by mouth 2 (two) times daily. 30 tablet 6   No current facility-administered medications on file prior to visit.    Allergies  Allergen Reactions  . Statins Other (See Comments)    Muscle weakness Arm Pain    Social History   Socioeconomic History  . Marital status: Divorced    Spouse name: Not on file  . Number of children: Not on file  . Years of education: Not on file  . Highest education level: Not on file  Occupational History  . Not on file  Tobacco Use  . Smoking status: Current Every Day Smoker    Packs/day: 1.50    Types: Cigarettes  . Smokeless tobacco: Never Used  Vaping Use  . Vaping Use: Never used  Substance and Sexual Activity  . Alcohol use: No  . Drug use: No  . Sexual activity: Not on file  Other Topics Concern  . Not on file  Social History Narrative  . Not on file   Social Determinants of Health   Financial Resource Strain: Not on file  Food Insecurity: Not on file  Transportation Needs: Not on file  Physical Activity: Not on file  Stress: Not on file  Social Connections: Not on file  Intimate Partner Violence: Not on file    Physical exam:  Vitals:   07/07/20 1524  BP: (!) 148/75  Pulse: 87  Resp: 20  Temp: 98.1 F (36.7 C)  SpO2: 96%  Weight: 135 lb (61.2 kg)  Height: 5\' 2"  (1.575 m)    Neck: Well-healed right neck scar well-healed left neck scar  Neuro: Symmetric upper extremity lower extremity motor  strength 5/5 no facial asymmetry  Extremities: 2+ femoral pulses  Data: Patient had a repeat carotid duplex exam today right carotid is still patent with greater than 80% stenosis velocities were 661/269.  Right common carotid stent was patent with mild in-stent restenosis.  Internal carotid was 40 to 60%.  Vertebrals were antegrade.  Assessment: Recurrent right internal carotid artery stenosis greater than 80% at risk for stroke  Plan: Discussed with patient today quitting smoking she is going to continue to try.  We will continue her Plavix and aspirin and will proceed with right TCAR stent in the near future.  Patient has misgivings about possible blood transfusion.  She would like to do directed donation prior to her stenting procedure.  She has cardiology evaluation next week.  As soon as she can do her directed donation we will consider her for right TCAR stent.  Risk benefits possible complications and procedure details including not limited to bleeding infection cranial nerve injury stroke risk of 1% were discussed with the patient today.  She understands and agrees to proceed.  Fabienne Bruns, MD [facility] Office: [phone]

## 2020-07-13 NOTE — Progress Notes (Unsigned)
Cardiology Office Note:    Date:  07/15/2020   ID:  Isabella, [name] 05/16/1965, MRN [id number]  PCP:  Isabella Diamond, MD   [facility]  Cardiologist:  No primary care provider on file.  Advanced Practice Provider:  No care team member to display Electrophysiologist:  None    Referring MD: No ref. provider found    History of Present Illness:    Isabella [name] is a 55 y.o. female with a hx of carotid stenosis s/p left and right carotid endarterectomis with subsequent RICA stenting , innominate to aorta bypass, HTN, CAD, DMII, fatty liver disease, COPD, and prior stroke who was referred by Dr. Darrick Penna for pre-operative evaluation prior to right carotid artery stenting.  Patient states that she develops dizziness with walking due to right carotid stenosis. Unable to walk a flight of stairs due to dizziness. Able to walk 2-17miles but gets out of breath and stops to takes breaks because she is short winded and/or dizzy. No LE edema, orthopnea, PND. No known history of CAD. Had cath in 2014 in [place], [place] where she said the heart arteries had no significant blockages at that time. Intolerant to statins. Has not been on lipid lowering therapy. Continues to smoke but motivated to quit.  Past Medical History:  Diagnosis Date  . Anemia   . Arthritis    "lower back, knees, elbows"  . Complication of anesthesia 2013   Patient stated that she could not  "wake up from anesthesia and coded" during aorta surgery in [facility]; by account likely post-op aspiration with respiratory distress/reintubation 11/2011  . COPD (chronic obstructive pulmonary disease) (HCC)    patient states she has been diagnosed with COPD "but does not use any inhalers"  . Coronary artery disease   . Diabetes mellitus without complication (HCC)    Type II  . Fatty liver   . GERD (gastroesophageal reflux disease)    diet controlled  . History of hiatal hernia   . History of kidney stones    . Peripheral vascular disease (HCC)   . Pneumonia   . Poison sumac    acquired it approx on Friday.  On back, below left shoulder and under breasts  . Stroke [facility])     Past Surgical History:  Procedure Laterality Date  . ABDOMINAL AORTOGRAM W/LOWER EXTREMITY N/A 08/16/2017   Procedure: ABDOMINAL AORTOGRAM W/LOWER EXTREMITY;  Surgeon: Chuck Hint, MD;  Location: [facility] INVASIVE CV LAB;  Service: Cardiovascular;  Laterality: N/A;  . ABDOMINAL HYSTERECTOMY    . AORTA SURGERY     with sent placement  . AORTIC ARCH ANGIOGRAPHY  08/16/2017   Procedure: AORTIC ARCH ANGIOGRAPHY/ INNOMINTE BYPASS ANGIOGRAPHY;  Surgeon: Chuck Hint, MD;  [facility] INVAS[facility] CV LAB;  Service: Cardiovascular;;  . CAROTID ENDARTERECTOMY Right   . CYSTOCELE REPAIR    . DILATION AND CURETTAGE OF UTERUS    . ENDARTERECTOMY Left 06/25/2017   Procedure: LEFT CAROTID ENDARTERECTOMY;  Surgeon: Chuck Hint, MD;  Location: [facility] OR;  Service: Vascular;  Laterality: Left;  . HEMATOMA EVACUATION Right 12/18/2017   Procedure: EVACUATION HEMATOMA;  Surgeon: Nada Libman, MD;  Location: [facility] OR;  Service: Vascular;  Laterality: Right;  . INSERTION OF RETROGRADE CAROTID STENT Right 12/18/2017   Procedure: INSERTION OF RETROGRADE CAROTID STENT;  Surgeon: Sherren Kerns, MD;  Location: [facility] OR;  Service: Vascular;  Laterality: Right;  . PATCH ANGIOPLASTY Left 06/25/2017   Procedure: PATCH ANGIOPLASTY  OF LEFT CAROTID ARTERY USING HEMASHIELD PALTINUM FINESSE PATCH;  Surgeon: Chuck Hintickson, Christopher S, MD;  [facility];  Service: Vascular;  Laterality: Left;  . RECTOCELE REPAIR     x2    Current Medications: Current Meds  Medication Sig  . aspirin 81 MG chewable tablet Chew 81 mg by mouth daily.   . BD PEN NEEDLE NANO 2ND GEN 32G X 4 MM MISC   . clopidogrel (PLAVIX) 75 MG tablet TAKE 1 TABLET BY MOUTH EVERY DAY  . ezetimibe (ZETIA) 10 MG tablet Take 1 tablet (10 mg total) by mouth daily.  . Fingerstix  Lancets MISC Inject into the skin.  Marland Kitchen. glucose blood (PRECISION QID TEST) test strip 1 strip by Other route every day  . insulin glargine (LANTUS) 100 UNIT/ML Solostar Pen Inject 32 Units into the skin at bedtime.     Allergies:   Statins   Social History   Socioeconomic History  . Marital status: Divorced    Spouse name: Not on file  . Number of children: Not on file  . Years of education: Not on file  . Highest education level: Not on file  Occupational History  . Not on file  Tobacco Use  . Smoking status: Current Every Day Smoker    Packs/day: 1.50    Types: Cigarettes  . Smokeless tobacco: Never Used  Vaping Use  . Vaping Use: Never used  Substance and Sexual Activity  . Alcohol use: No  . Drug use: No  . Sexual activity: Not on file  Other Topics Concern  . Not on file  Social History Narrative  . Not on file   Social Determinants of Health   Financial Resource Strain: Not on file  Food Insecurity: Not on file  Transportation Needs: Not on file  Physical Activity: Not on file  Stress: Not on file  Social Connections: Not on file     Family History: The patient's family history includes AAA (abdominal aortic aneurysm) in her sister; Peripheral Artery Disease in her brother.  ROS:   Please see the history of present illness.    Review of Systems  Constitutional: Negative for chills and fever.  HENT: Negative for hearing loss.   Eyes: Negative for blurred vision and redness.  Respiratory: Positive for shortness of breath.   Cardiovascular: Negative for chest pain, palpitations, orthopnea, claudication, leg swelling and PND.  Gastrointestinal: Negative for melena, nausea and vomiting.  Genitourinary: Negative for dysuria and flank pain.  Musculoskeletal: Positive for falls and myalgias.  Neurological: Positive for dizziness. Negative for loss of consciousness.  Endo/Heme/Allergies: Negative for polydipsia.  Psychiatric/Behavioral: Negative for memory loss.     EKGs/Labs/Other Studies Reviewed:    The following studies were reviewed today: Peripheral Angio 2019: .  Patent aorto innominate bypass.  There is no stenosis noted within the bypass graft.  I think what was seen on duplex is where the graft is anastomosed to the distal innominate artery where it bifurcates into the subclavian and right common carotid artery. 2.  Tapering with a 80-90%% narrowing in the common carotid artery on the right proximally.  The subclavian artery is patent with no significant stenosis. 3.  The left common carotid artery and carotid bifurcation are patent as is the endarterectomy site.  Both vertebral arteries are patent. 4.  Single renal arteries bilaterally with no significant renal artery stenosis identified.  Mild diffuse disease of the infrarenal aorta.  The common iliac, external iliac, and hypogastric arteries are patent bilaterally.  5.  On the right side the common femoral, deep femoral, superficial femoral, popliteal, anterior tibial, posterior tibial, and peroneal arteries are patent.  There is no significant infrainguinal arterial occlusive disease on the right. 6.  On the left side the common femoral, deep femoral, superficial femoral, popliteal, anterior tibial, posterior tibial, peroneal arteries are patent.  There is no significant infrainguinal arterial occlusive disease on the left.  Carotid Dopplers 07/07/20:   Right Carotid Findings:  +----------+--------+--------+--------+------------------+--------+       PSV cm/sEDV cm/sStenosisPlaque DescriptionComments  +----------+--------+--------+--------+------------------+--------+  CCA Prox 185   45   >50%           stent    +----------+--------+--------+--------+------------------+--------+  CCA Mid  118   35                      +----------+--------+--------+--------+------------------+--------+  CCA Distal100   28        heterogenous         +----------+--------+--------+--------+------------------+--------+  ICA Prox 661   269   80-99% heterogenous         +----------+--------+--------+--------+------------------+--------+  ICA Mid  186   52   40-59%                +----------+--------+--------+--------+------------------+--------+  ICA Distal101   43   40-59%                +----------+--------+--------+--------+------------------+--------+  ECA    75   16                      +----------+--------+--------+--------+------------------+--------+   +----------+--------+-------+--------+-------------------+       PSV cm/sEDV cmsDescribeArm Pressure (mmHG)  +----------+--------+-------+--------+-------------------+  [id number]   6   Stenotic            +----------+--------+-------+--------+-------------------+   +---------+--------+---+--------+--+---------+  VertebralPSV cm/s114EDV cm/s18Antegrade  +---------+--------+---+--------+--+---------+      Right Stent(s):  +---------------+---+--+---------------+++  Proximal Stent 5621308-65% stenosis  +---------------+---+--+---------------+++  Mid Stent   7846962-95% stenosis  +---------------+---+--+---------------+++  Distal Stent  2841324-40% stenosis  +---------------+---+--+---------------+++  Distal to [id number]          +---------------+---+--+---------------+++          Left Carotid Findings:  +----------+--------+--------+--------+------------------+--------+       PSV cm/sEDV cm/sStenosisPlaque DescriptionComments  +----------+--------+--------+--------+------------------+--------+  CCA Prox 145   35                      +----------+--------+--------+--------+------------------+--------+  CCA Mid   148   39       homogeneous          +----------+--------+--------+--------+------------------+--------+  CCA Distal125   28       heterogenous         +----------+--------+--------+--------+------------------+--------+  ICA Prox 123   42   40-59%                +----------+--------+--------+--------+------------------+--------+  ICA Mid  127   46   40-59%                +----------+--------+--------+--------+------------------+--------+  ICA Distal151   62   40-59%                +----------+--------+--------+--------+------------------+--------+  ECA    139   12                      +----------+--------+--------+--------+------------------+--------+   +----------+--------+--------+--------+-------------------+       PSV cm/sEDV cm/sDescribeArm Pressure (mmHG)  +----------+--------+--------+--------+-------------------+  [id number]   9  Stenotic            +----------+--------+--------+--------+-------------------+   +---------+--------+---+--------+--+---------+  VertebralPSV cm/s109EDV cm/s38Antegrade  +---------+--------+---+--------+--+---------+         Summary:  Right Carotid: Velocities in the right ICA are consistent with a 80-99%         stenosis. Patent stent with increased velocity throughout  with         velocity in the 50 - 75% stenosis range.   Left Carotid: Patent left carotid endarterectomy with velocity in the left  ICA        consistent with a 40-59% stenosis.   CTA head and neck 12/2019: CTA HEAD FINDINGS  Anterior circulation:  The intracranial internal carotid arteries are patent. Calcified plaque within both vessels. As before, there is moderate/severe stenosis of the distal cavernous/paraclinoid segments  bilaterally.  The M1 middle cerebral arteries are patent. Redemonstrated mild narrowing of the distal M1 right MCA. As before, there is atherosclerotic irregularity of the M2 and more distal MCA branch vessels bilateral. However, no M2 proximal branch occlusion or high-grade proximal arterial stenosis is identified.  The anterior cerebral arteries are patent.  No intracranial aneurysm is identified.  Posterior circulation:  The non dominant intracranial right vertebral artery is developmentally diminutive, but patent. The dominant intracranial left vertebral artery is patent without significant stenosis, as is the basilar artery. The posterior cerebral arteries are patent bilaterally without high-grade proximal stenosis. Posterior communicating arteries are hypoplastic or absent bilaterally.  Venous sinuses: Within limitations of contrast timing, no convincing thrombus.  Anatomic variants: As described  Review of the MIP images confirms the above findings  Impression #2 under the CTA neck impression section below will be called to the ordering clinician or representative by the Radiologist Assistant, and communication documented in the PACS or [employer].  IMPRESSION: CT head:  1. Motion degraded examination. 2. Within this limitation, there is an unremarkable non-contrast CT appearance of the brain. No evidence of acute intracranial abnormality. 3. Mild ethmoid sinus mucosal thickening. 4. Left maxillary sinus mucous retention cyst.  CTA neck:  1. There has been interval placement of a stent within the proximal to mid right common carotid artery. Motion artifact precludes adequate evaluation for intra-stent stenosis within the proximal aspect of the stent. Flow-related enhancement is appreciated within the remainder of the stent. 2. Interval development of a high-grade near-occlusive short-segment stenosis of the proximal right ICA with  radiographic string sign. Distal to this, the right ICA is patent within the neck without hemodynamically significant stenosis. 3. Interval left carotid endarterectomy. The left CCA and ICA are now patent within the neck without measurable stenosis. 4. The vertebral arteries are patent within the neck bilaterally. As before, there is multifocal mild-to-moderate stenosis of the V2 right vertebral artery.  CTA head:  1. No intracranial large vessel occlusion. 2. Redemonstrated moderate/severe stenoses of the distal cavernous/paraclinoid internal carotid arteries bilaterally.  EKG:  EKG is  ordered today.  The ekg ordered today demonstrates NSR with HR 88  Recent Labs: 01/12/2020: Creatinine, Ser 0.60  Recent Lipid Panel No results found for: CHOL, TRIG, HDL, CHOLHDL, VLDL, LDLCALC, LDLDIRECT    Physical Exam:    VS:  BP 140/70   Pulse 88   Ht 5' 1.75" (1.568 m)   Wt 139 lb 12.8 oz (63.4 kg)   SpO2 96%   BMI 25.78 kg/m     Wt Readings from Last 3 Encounters:  07/15/20 139 lb 12.8 oz (63.4 kg)  07/07/20 135  lb (61.2 kg)  01/14/20 140 lb 14.4 oz (63.9 kg)     GEN:  Well nourished, well developed in no acute distress HEENT: Normal NECK: No JVD CARDIAC: RRR, no murmurs, rubs, gallops RESPIRATORY:  Clear to auscultation without rales, wheezing or rhonchi  ABDOMEN: Soft, non-tender, non-distended MUSCULOSKELETAL:  No edema; No deformity  SKIN: Warm and dry NEUROLOGIC:  Alert and oriented x 3 PSYCHIATRIC:  Normal affect   ASSESSMENT:    1. Carotid stenosis, bilateral   2. Coronary arteriosclerosis   3. Essential hypertension   4. Preoperative clearance   5. Mixed hyperlipidemia   6. Chronic obstructive pulmonary disease, unspecified COPD type (HCC)   7. Tobacco abuse   8. Type 2 diabetes mellitus with complication, with long-term current use of insulin (HCC)   9. Peripheral vascular disease, unspecified (HCC)    PLAN:    In order of problems listed  above:  #Pre-operative Evaluation prior to right carotid artery stenting: Patient with history of bilateral CAS s/p bilateral endarterectomy (at different times) with restenosis of the right internal carotid s/p stenting in 2019 now with symptomatic re-stenosis planned for re-intervention. Patient is limited in exercise capacity due to dizziness she develops from CAS as well as shortness of breath. Will check lexiscan as patient is very high risk for CAD -Check lexiscan -If low-to-intermediate risk, no further testing prior to surgery  #PVD:  S/p aoroinominate bypass in the past. Also with bilateral carotid artery stenosis requiring endarterectomy (right and left) in the past now planned for right carotid stenting as above. -Continue plavix and ASA -Intolerant of statins--will start zetia and refer to lipid clinic for repatha if possible  #DMII: On insulin -Management per PCP  #COPD: #Tobacco Abuse: -Tobacco cessation counseling provided  Shared Decision Making/Informed Consent The risks [chest pain, shortness of breath, cardiac arrhythmias, dizziness, blood pressure fluctuations, myocardial infarction, stroke/transient ischemic attack, nausea, vomiting, allergic reaction, radiation exposure, metallic taste sensation and life-threatening complications (estimated to be 1 in 10,000)], benefits (risk stratification, diagnosing coronary artery disease, treatment guidance) and alternatives of a nuclear stress test were discussed in detail with Ms. Paola and she agrees to proceed.   Medication Adjustments/Labs and Tests Ordered: Current medicines are reviewed at length with the patient today.  Concerns regarding medicines are outlined above.  Orders Placed This Encounter  Procedures  . AMB Referral to Advanced Lipid Disorders Clinic  . MYOCARDIAL PERFUSION IMAGING  . EKG 12-Lead   Meds ordered this encounter  Medications  . ezetimibe (ZETIA) 10 MG tablet    Sig: Take 1 tablet (10 mg  total) by mouth daily.    Dispense:  90 tablet    Refill:  3    Patient Instructions  Medication Instructions:  Your physician has recommended you make the following change in your medication:  1.  START Zetia 10 mg taking 1 daily   *If you need a refill on your cardiac medications before your next appointment, please call your pharmacy*   Lab Work: None ordered  If you have labs (blood work) drawn today and your tests are completely normal, you will receive your results only by: Marland Kitchen MyChart Message (if you have MyChart) OR . A paper copy in the mail If you have any lab test that is abnormal or we need to change your treatment, we will call you to review the results.   Testing/Procedures: Your physician has requested that you have a lexiscan myoview. For further information please visit [email or web address] Please follow instruction  sheet, as BELOW:    You are scheduled for a Myocardial Perfusion Imaging Study Please arrive 15 minutes prior to your appointment time for registration and insurance purposes.  The test will take approximately 3 to 4 hours to complete; you may bring reading material.  If someone comes with you to your appointment, they will need to remain in the main lobby due to limited space in the testing area. **If you are pregnant or breastfeeding, please notify the nuclear lab prior to your appointment**  How to prepare for your Myocardial Perfusion Test: . Do not eat or drink 3 hours prior to your test, except you may have water. . Do not consume products containing caffeine (regular or decaffeinated) 12 hours prior to your test. (ex: coffee, chocolate, sodas, tea). . Do bring a list of your current medications with you.  If not listed below, you may take your medications as normal.  . Hold the Insulin the morning of your test. . Do wear comfortable clothes (no dresses or overalls) and walking shoes, tennis shoes preferred (No heels or open toe shoes are  allowed). . Do NOT wear cologne, perfume, aftershave, or lotions (deodorant is allowed). . If these instructions are not followed, your test will have to be rescheduled.   .  You have been referred to Lipid Clinic for your Cholesterol     Follow-Up: At [facility], you and your health needs are our priority.  As part of our continuing mission to provide you with exceptional heart care, we have created designated Provider Care Teams.  These Care Teams include your primary Cardiologist (physician) and Advanced Practice Providers (APPs -  Physician Assistants and Nurse Practitioners) who all work together to provide you with the care you need, when you need it.  We recommend signing up for the patient portal called "MyChart".  Sign up information is provided on this After Visit Summary.  MyChart is used to connect with patients for Virtual Visits (Telemedicine).  Patients are able to view lab/test results, encounter notes, upcoming appointments, etc.  Non-urgent messages can be sent to your provider as well.   To learn more about what you can do with MyChart, go to [email or web address].    Your next appointment:   3 Months  The format for your next appointment:   In Person  Provider:   Laurance Flatten, MD   Other Instructions      Signed, Meriam Sprague, MD  07/15/2020 3:18 PM    [facility]

## 2020-07-15 ENCOUNTER — Ambulatory Visit: Payer: Medicare PPO | Admitting: Cardiology

## 2020-07-15 ENCOUNTER — Encounter: Payer: Self-pay | Admitting: Cardiology

## 2020-07-15 ENCOUNTER — Other Ambulatory Visit: Payer: Self-pay

## 2020-07-15 VITALS — BP 140/70 | HR 88 | Ht 61.75 in | Wt 139.8 lb

## 2020-07-15 DIAGNOSIS — I10 Essential (primary) hypertension: Secondary | ICD-10-CM | POA: Diagnosis not present

## 2020-07-15 DIAGNOSIS — I6523 Occlusion and stenosis of bilateral carotid arteries: Secondary | ICD-10-CM

## 2020-07-15 DIAGNOSIS — J449 Chronic obstructive pulmonary disease, unspecified: Secondary | ICD-10-CM

## 2020-07-15 DIAGNOSIS — I2510 Atherosclerotic heart disease of native coronary artery without angina pectoris: Secondary | ICD-10-CM | POA: Diagnosis not present

## 2020-07-15 DIAGNOSIS — Z01818 Encounter for other preprocedural examination: Secondary | ICD-10-CM

## 2020-07-15 DIAGNOSIS — Z01810 Encounter for preprocedural cardiovascular examination: Secondary | ICD-10-CM

## 2020-07-15 DIAGNOSIS — Z720 Tobacco use: Secondary | ICD-10-CM

## 2020-07-15 DIAGNOSIS — I739 Peripheral vascular disease, unspecified: Secondary | ICD-10-CM

## 2020-07-15 DIAGNOSIS — Z794 Long term (current) use of insulin: Secondary | ICD-10-CM

## 2020-07-15 DIAGNOSIS — E118 Type 2 diabetes mellitus with unspecified complications: Secondary | ICD-10-CM

## 2020-07-15 DIAGNOSIS — E782 Mixed hyperlipidemia: Secondary | ICD-10-CM

## 2020-07-15 MED ORDER — EZETIMIBE 10 MG PO TABS: 10.0000 mg | ORAL_TABLET | Freq: Every day | ORAL | 3 refills | Status: DC

## 2020-07-15 NOTE — Patient Instructions (Addendum)
Medication Instructions:  Your physician has recommended you make the following change in your medication:  1.  START Zetia 10 mg taking 1 daily   *If you need a refill on your cardiac medications before your next appointment, please call your pharmacy*   Lab Work: None ordered  If you have labs (blood work) drawn today and your tests are completely normal, you will receive your results only by: Marland Kitchen MyChart Message (if you have MyChart) OR . A paper copy in the mail If you have any lab test that is abnormal or we need to change your treatment, we will call you to review the results.   Testing/Procedures: Your physician has requested that you have a lexiscan myoview. For further information please visit [email or web address] Please follow instruction sheet, as BELOW:    You are scheduled for a Myocardial Perfusion Imaging Study Please arrive 15 minutes prior to your appointment time for registration and insurance purposes.  The test will take approximately 3 to 4 hours to complete; you may bring reading material.  If someone comes with you to your appointment, they will need to remain in the main lobby due to limited space in the testing area. **If you are pregnant or breastfeeding, please notify the nuclear lab prior to your appointment**  How to prepare for your Myocardial Perfusion Test: . Do not eat or drink 3 hours prior to your test, except you may have water. . Do not consume products containing caffeine (regular or decaffeinated) 12 hours prior to your test. (ex: coffee, chocolate, sodas, tea). . Do bring a list of your current medications with you.  If not listed below, you may take your medications as normal.  . Hold the Insulin the morning of your test. . Do wear comfortable clothes (no dresses or overalls) and walking shoes, tennis shoes preferred (No heels or open toe shoes are allowed). . Do NOT wear cologne, perfume, aftershave, or lotions (deodorant is allowed). . If  these instructions are not followed, your test will have to be rescheduled.   .  You have been referred to Lipid Clinic for your Cholesterol     Follow-Up: At [facility], you and your health needs are our priority.  As part of our continuing mission to provide you with exceptional heart care, we have created designated Provider Care Teams.  These Care Teams include your primary Cardiologist (physician) and Advanced Practice Providers (APPs -  Physician Assistants and Nurse Practitioners) who all work together to provide you with the care you need, when you need it.  We recommend signing up for the patient portal called "MyChart".  Sign up information is provided on this After Visit Summary.  MyChart is used to connect with patients for Virtual Visits (Telemedicine).  Patients are able to view lab/test results, encounter notes, upcoming appointments, etc.  Non-urgent messages can be sent to your provider as well.   To learn more about what you can do with MyChart, go to [email or web address].    Your next appointment:   3 Months  The format for your next appointment:   In Person  Provider:   Laurance Flatten, MD   Other Instructions

## 2020-07-18 ENCOUNTER — Ambulatory Visit: Payer: Medicare PPO | Admitting: Cardiology

## 2020-07-26 ENCOUNTER — Ambulatory Visit: Payer: Medicare PPO

## 2020-07-26 ENCOUNTER — Telehealth: Payer: Self-pay

## 2020-07-26 NOTE — Progress Notes (Deleted)
Patient ID: Isabella [name]                 DOB: Oct 31, 1965                    MRN: [id number]     HPI: Isabella [name] is a 55 y.o. female patient referred to lipid clinic by Dr Shari Prows. PMH is significant for bilateral carotid stenosis s/p bilateral endarterectomy (at different times) with restenosis of the right internal carotid s/p stenting in 2019 now with symptomatic re-stenosis planned for re-intervention, CT angio 01/12/20 showed atherosclerotic plaque of the aortic arch and proximal major branch vessels of the neck, high-grade near occlusive short-segment stenosis within proximal ICA, calcified plaque in both intracranial internal carotid arteries, HTN, DM2, fatty liver disease, prior stroke, PVD s/p bypass, and COPD. Initially seen by Dr Shari Prows on 07/15/20 for pre op evaluation prior to upcoming right carotid artery stenting. She was scheduled for lexiscan (scheduled for this Friday), started on ezetimibe, and referred to lipid clinic due to statin intolerance.  Specific statins and side effects? Smoking rosuva 5, pcsk9i, vascepa LDL 170, TG 216 last oct Has [employer] medicare - no deductible, $47/1 mo or $131/3 mo Can discuss HWF  Current Medications: ezetimibe 10mg  daily Intolerances: muscle weakness and arm pain with statins Risk Factors: CAD, tobacco abuse, DM, HTN, PVD LDL goal: <55 mg/dL  Diet:  Exercise: Dizziness when walking due to carotid stenosis. Unable to walk a flight of stairs due to dizziness.   Family History: AAA (abdominal aortic aneurysm) in her sister; Peripheral Artery Disease in her brother.  Social History: Smokes 1.5 PPD  Labs: 03/08/20: TC 241, HDL 28, LDL 170, TG 216  Past Medical History:  Diagnosis Date  . Anemia   . Arthritis    "lower back, knees, elbows"  . Complication of anesthesia 2013   Patient stated that she could not  "wake up from anesthesia and coded" during aorta surgery in [facility]; by account likely post-op aspiration with  respiratory distress/reintubation 11/2011  . COPD (chronic obstructive pulmonary disease) (HCC)    patient states she has been diagnosed with COPD "but does not use any inhalers"  . Coronary artery disease   . Diabetes mellitus without complication (HCC)    Type II  . Fatty liver   . GERD (gastroesophageal reflux disease)    diet controlled  . History of hiatal hernia   . History of kidney stones   . Peripheral vascular disease (HCC)   . Pneumonia   . Poison sumac    acquired it approx on Friday.  On back, below left shoulder and under breasts  . Stroke [facility])     Current Outpatient Medications on File Prior to Visit  Medication Sig Dispense Refill  . aspirin 81 MG chewable tablet Chew 81 mg by mouth daily.     . BD PEN NEEDLE NANO 2ND GEN 32G X 4 MM MISC     . clopidogrel (PLAVIX) 75 MG tablet TAKE 1 TABLET BY MOUTH EVERY DAY 90 tablet 2  . ezetimibe (ZETIA) 10 MG tablet Take 1 tablet (10 mg total) by mouth daily. 90 tablet 3  . Fingerstix Lancets MISC Inject into the skin.    [facility] glucose blood (PRECISION QID TEST) test strip 1 strip by Other route every day    . insulin glargine (LANTUS) 100 UNIT/ML Solostar Pen Inject 32 Units into the skin at bedtime.     No current facility-administered medications on  file prior to visit.    Allergies  Allergen Reactions  . Statins Other (See Comments)    Muscle weakness Arm Pain    Assessment/Plan:  1. Hyperlipidemia -

## 2020-07-26 NOTE — Telephone Encounter (Signed)
Lm for missed appt

## 2020-07-27 ENCOUNTER — Telehealth (HOSPITAL_COMMUNITY): Payer: Self-pay | Admitting: *Deleted

## 2020-07-27 NOTE — Telephone Encounter (Signed)
Left message on voicemail per DPR in reference to upcoming appointment scheduled on 07/29/20 at 10:45 with detailed instructions given per Myocardial Perfusion Study Information Sheet for the test. LM to arrive 15 minutes early, and that it is imperative to arrive on time for appointment to keep from having the test rescheduled. If you need to cancel or reschedule your appointment, please call the office within 24 hours of your appointment. Failure to do so may result in a cancellation of your appointment, and a $50 no show fee. Phone number given for call back for any questions.

## 2020-07-28 ENCOUNTER — Other Ambulatory Visit: Payer: Self-pay

## 2020-07-29 ENCOUNTER — Other Ambulatory Visit: Payer: Self-pay

## 2020-07-29 ENCOUNTER — Ambulatory Visit (HOSPITAL_COMMUNITY): Payer: Medicare PPO | Attending: Cardiology

## 2020-07-29 VITALS — Ht 61.75 in | Wt 139.0 lb

## 2020-07-29 DIAGNOSIS — I2510 Atherosclerotic heart disease of native coronary artery without angina pectoris: Secondary | ICD-10-CM | POA: Diagnosis present

## 2020-07-29 DIAGNOSIS — R110 Nausea: Secondary | ICD-10-CM | POA: Insufficient documentation

## 2020-07-29 MED ORDER — TECHNETIUM TC 99M TETROFOSMIN IV KIT
10.1000 | PACK | Freq: Once | INTRAVENOUS | Status: AC | PRN
  Administered 2020-07-29: 10.1 via INTRAVENOUS
  Filled 2020-07-29: qty 11

## 2020-08-01 ENCOUNTER — Telehealth (HOSPITAL_COMMUNITY): Payer: Self-pay

## 2020-08-01 NOTE — Telephone Encounter (Signed)
Spoke with the patient, detailed instructions given. She stated that she understood and would be here for her test. S.Williams EMTP 

## 2020-08-02 ENCOUNTER — Telehealth: Payer: Self-pay | Admitting: Cardiology

## 2020-08-02 ENCOUNTER — Ambulatory Visit (HOSPITAL_COMMUNITY): Payer: Medicare PPO

## 2020-08-02 NOTE — Telephone Encounter (Signed)
Patient was supposed to do Myocardial Perfusion Pt. 2 but mistaken drunk caffeine. Have to reschedule and put requests back into Epic. Please call

## 2020-08-04 ENCOUNTER — Other Ambulatory Visit: Payer: Self-pay

## 2020-08-04 ENCOUNTER — Ambulatory Visit (HOSPITAL_COMMUNITY): Payer: Medicare PPO | Attending: Cardiology

## 2020-08-04 DIAGNOSIS — R110 Nausea: Secondary | ICD-10-CM | POA: Insufficient documentation

## 2020-08-04 DIAGNOSIS — I2510 Atherosclerotic heart disease of native coronary artery without angina pectoris: Secondary | ICD-10-CM | POA: Insufficient documentation

## 2020-08-04 LAB — MYOCARDIAL PERFUSION IMAGING
LV dias vol: 66 mL (ref 46–106)
LV sys vol: 26 mL
Peak HR: 94 {beats}/min
Rest HR: 77 {beats}/min
SDS: 1
SRS: 0
SSS: 1
TID: 0.99

## 2020-08-04 MED ORDER — AMINOPHYLLINE 25 MG/ML IV SOLN
75.0000 mg | Freq: Once | INTRAVENOUS | Status: AC
  Administered 2020-08-04: 75 mg via INTRAVENOUS

## 2020-08-04 MED ORDER — REGADENOSON 0.4 MG/5ML IV SOLN
0.4000 mg | Freq: Once | INTRAVENOUS | Status: AC
  Administered 2020-08-04: 0.4 mg via INTRAVENOUS

## 2020-08-04 MED ORDER — TECHNETIUM TC 99M TETROFOSMIN IV KIT
29.9000 | PACK | Freq: Once | INTRAVENOUS | Status: AC | PRN
  Administered 2020-08-04: 29.9 via INTRAVENOUS
  Filled 2020-08-04: qty 30

## 2020-08-05 NOTE — Progress Notes (Signed)
Pt has been made aware of normal result and verbalized understanding.  jw

## 2020-08-11 NOTE — Pre-Procedure Instructions (Signed)
Isabella [name]  08/11/2020     Your procedure is scheduled on Monday, April   4.  Report to [facility], Main Entrance or Entrance "A" at 5:30 AM.                Your surgery or procedure is scheduled to begin at 7:30 AM   Call this number if you have problems the morning of surgery: [phone]  This is the number for the Pre- Surgical Desk.                For any other questions, please call [phone], Monday - Friday 8 AM - 4 PM.   Remember:  Do not eat or drink after midnight Sunday, April 3.                     Take these medicines the morning of surgery with A SIP OF WATER : ezetimibe (ZETIA)  Follow Dr. Darrick Penna instructions regarding Plavix   WHAT DO I DO ABOUT MY DIABETES MEDICATION?  . THE NIGHT BEFORE SURGERY, take 16 units of  Lantus insulin.      STOP taking Aspirin, Aspirin Products (Goody Powder, Excedrin Migraine), Ibuprofen (Advil), Naproxen (Aleve), Vitamins and Herbal Products (ie Fish Oil).  How to Manage Your Diabetes Before and After Surgery  Why is it important to control my blood sugar before and after surgery? . Improving blood sugar levels before and after surgery helps healing and can limit problems. . A way of improving blood sugar control is eating a healthy diet by: o  Eating less sugar and carbohydrates o  Increasing activity/exercise o  Talking with your doctor about reaching your blood sugar goals . High blood sugars (greater than 180 mg/dL) can raise your risk of infections and slow your recovery, so you will need to focus on controlling your diabetes during the weeks before surgery. . Make sure that the doctor who takes care of your diabetes knows about your planned surgery including the date and location.  How do I manage my blood sugar before surgery? . Check your blood sugar at least 4 times a day, starting 2 days before surgery, to make sure that the level is not too high or low. o Check your blood sugar the morning of  your surgery when you wake up and every 2 hours until you get to the Short Stay unit. . If your blood sugar is less than 70 mg/dL, you will need to treat for low blood sugar: o Do not take insulin. o Treat a low blood sugar (less than 70 mg/dL) with  cup of clear juice (cranberry or apple), 4 glucose tablets, OR glucose gel. Recheck blood sugar in 15 minutes after treatment (to make sure it is greater than 70 mg/dL). If your blood sugar is not greater than 70 mg/dL on recheck, call [phone] o  for further instructions. . Report your blood sugar to the short stay nurse when you get to Short Stay.  . If you are admitted to the hospital after surgery: o Your blood sugar will be checked by the staff and you will probably be given insulin after surgery (instead of oral diabetes medicines) to make sure you have good blood sugar levels. o The goal for blood sugar control after surgery is 80-180 mg/d  Special instructions:  DO NOT Smoke within 24 hours prior to surgery. Do not shave legs, underarms at least within 48 hours prior to surgery.  [place]- Preparing For Surgery  Before surgery, you can play an important role. Because skin is not sterile, your skin needs to be as free of germs as possible. You can reduce the number of germs on your skin by washing with CHG (chlorahexidine gluconate) Soap before surgery.  CHG is an antiseptic cleaner which kills germs and bonds with the skin to continue killing germs even after washing.    Oral Hygiene is also important to reduce your risk of infection.  Remember - BRUSH YOUR TEETH THE MORNING OF SURGERY WITH YOUR REGULAR TOOTHPASTE  Please do not use if you have an allergy to CHG or antibacterial soaps. If your skin becomes reddened/irritated stop using the CHG.    Please follow these instructions carefully.   1. Shower the NIGHT BEFORE SURGERY and the MORNING OF SURGERY with CHG.   2. If you chose to wash your hair, wash your hair first as  usual with your normal shampoo.  3. After you shampoo, wash your face and private area with the soap you use at home, then rinse your hair and body thoroughly to remove the shampoo and soap.  4. Use CHG as you would any other liquid soap. You can apply CHG directly to the skin and wash gently with a scrungie or a clean washcloth.   5. Apply the CHG Soap to your body ONLY FROM THE NECK DOWN.  Do not use on open wounds or open sores. Avoid contact with your eyes, ears, mouth and genitals (private parts).   6. Wash thoroughly, paying special attention to the area where your surgery will be performed.  7. Thoroughly rinse your body with warm water from the neck down.  8. DO NOT shower/wash with your normal soap after using and rinsing off the CHG Soap.  9. Pat yourself dry with a CLEAN TOWEL.  10. Wear CLEAN PAJAMAS to bed the night before surgery, wear comfortable clothes the morning of surgery  11. Place CLEAN SHEETS on your bed the night of your first shower and DO NOT SLEEP WITH PETS.  Day of Surgery: Shower as instructed above. Do not apply any deodorants/lotions, powders or colognes.  Please wear clean clothes to the hospital/surgery center.   Remember to brush your teeth WITH YOUR REGULAR TOOTHPASTE.  Do not wear jewelry, make-up or nail polish.  Do not shave 48 hours prior to surgery.  Men may shave face and neck.  Do not bring valuables to the hospital.  [facility] is not responsible for any belongings or valuables.  Contacts, dentures or bridgework may not be worn into surgery.  Leave your suitcase in the car.  After surgery it may be brought to your room.  For patients admitted to the hospital, discharge time will be determined by your treatment team.  Patients discharged the day of surgery will not be allowed to drive home.   Please read over the fact sheets that you were given.

## 2020-08-12 ENCOUNTER — Other Ambulatory Visit: Payer: Self-pay

## 2020-08-12 ENCOUNTER — Encounter (HOSPITAL_COMMUNITY): Payer: Self-pay

## 2020-08-12 ENCOUNTER — Encounter (HOSPITAL_COMMUNITY)
Admission: RE | Admit: 2020-08-12 | Discharge: 2020-08-12 | Disposition: A | Discharge status: Home / Self Care | Payer: Medicare PPO | Source: Ambulatory Visit | Attending: Vascular Surgery | Admitting: Vascular Surgery

## 2020-08-12 ENCOUNTER — Other Ambulatory Visit (HOSPITAL_COMMUNITY): Payer: Medicare PPO

## 2020-08-12 DIAGNOSIS — Z01812 Encounter for preprocedural laboratory examination: Secondary | ICD-10-CM | POA: Diagnosis present

## 2020-08-12 LAB — GLUCOSE, CAPILLARY: Glucose-Capillary: 168 mg/dL — ABNORMAL HIGH (ref 70–99)

## 2020-08-12 NOTE — Progress Notes (Addendum)
I called [facility] and spoke to Lafonda Mosses about Ms Houpt's Autologous blood for transfusion if needed. Lafonda Mosses called [facility] to see if patient had given her blood, the answer was no.  Ms Georgiades is late for PAT appointment, I called patient, she was lost, I gave patient the directions.  Ms Lascala went to [facility] on 08/06/20 to donate blood and was told that she has to donate 2 weeks before surgery.  Ms Dowding said that her daughter could donate blood for her the day of surgery.  I cqalled [facility] and was told that a direct transfusion has to be done 3 days before surgery.  Ms Masek said she would go get her daughter and take her to [facility] at 4 pm.  Ms Lukins said that really she just needed to reschedule surgery and donate her blood to have on hand.  I called Dr. Darrick Penna office and spoke to [place], I gave [facility] the inform,ation. Corrie Dandy had to call me back, she said that surgery will be rescheduled, for Ms Willhite to call Dr. Darrick Penna office after she had given blood at [facility] and that a new date for surgery will be set up. I gave Ms Pletz the information and she said she was going to [facility] now.

## 2020-08-31 ENCOUNTER — Telehealth: Payer: Self-pay | Admitting: *Deleted

## 2020-08-31 ENCOUNTER — Other Ambulatory Visit: Payer: Self-pay | Admitting: *Deleted

## 2020-08-31 NOTE — Telephone Encounter (Signed)
Pt called and left a message stating that she could not get in contact with [facility] for her directed blood donation prior to surgery. She also stated that she did not have the $35 co-pay for her pre surgical visit with Dr. Darrick Penna tomorrow. Spoke with Dr. Darrick Penna. He is not willing to operate on her unless she is seen in the office. I also contacted [facility] and got a direct number for the [place] location. That number is [phone]. I was unable to reach the patient when I called back and her voice mail is full. Will try to call back later today.

## 2020-09-01 ENCOUNTER — Encounter: Payer: Self-pay | Admitting: Vascular Surgery

## 2020-09-01 ENCOUNTER — Ambulatory Visit: Payer: Medicare PPO | Admitting: Vascular Surgery

## 2020-09-01 ENCOUNTER — Other Ambulatory Visit: Payer: Self-pay

## 2020-09-01 VITALS — BP 160/74 | HR 95 | Temp 98.4°F | Resp 20 | Ht 61.0 in | Wt 136.4 lb

## 2020-09-01 DIAGNOSIS — I6521 Occlusion and stenosis of right carotid artery: Secondary | ICD-10-CM | POA: Diagnosis not present

## 2020-09-01 MED ORDER — ROSUVASTATIN CALCIUM 10 MG PO TABS: 10.0000 mg | ORAL_TABLET | Freq: Every day | ORAL | 12 refills | Status: DC

## 2020-09-01 NOTE — Progress Notes (Signed)
Patient is a 55 year old female who returns for follow-up today.  We have previously considered doing a right TCAR on her.  She had concerns about COVID testing and personnel in the operating room on 2 prior occasions.  Apparently the last time she was scheduled the operation was canceled because her directed blood donation was not available.  She now states that she is ready to proceed with right TCAR.  She has had a previous right common carotid stent.  This is well below the clavicle.  This was placed in 2019.  Her left carotid circulation is patent.  She has no significant intracranial stenosis.  This is based on a CT angiogram from September 2021.  She has also had a previous innominate bypass in [place] many years ago.  She has also had a previous right carotid endarterectomy in [place] many years ago and a left carotid endarterectomy mobile partner Dr. Edilia Bo several years ago.  She is on Plavix aspirin and a statin was represcribed today.  It was emphasized to the patient that she will need to be on the statin for at least 6 weeks perirocedure.  She states that she is taking her aspirin and Plavix.  Her most recent carotid duplex scan in February 2022 showed velocities on the right side of 661/269 with a patent right common carotid stent left side was 40 to 60%.  Vertebrals were antegrade.  Past Medical History:  Diagnosis Date  . Anemia   . Arthritis    "lower back, knees, elbows"  . Carotid artery occlusion   . Complication of anesthesia 2013   Patient stated that she could not  "wake up from anesthesia and coded" during aorta surgery in [facility]; by account likely post-op aspiration with respiratory distress/reintubation 11/2011  . COPD (chronic obstructive pulmonary disease) (HCC)    patient states she has been diagnosed with COPD "but does not use any inhalers"  . Coronary artery disease   . Diabetes mellitus without complication (HCC)    Type II  . Fatty liver   . GERD  (gastroesophageal reflux disease)    diet controlled  . History of hiatal hernia   . History of kidney stones   . Peripheral vascular disease (HCC)   . Pneumonia   . Poison sumac    acquired it approx on Friday.  On back, below left shoulder and under breasts  . Stroke [facility]) 2014   , expressive aphisa at times, Left hand cannot close    Past Surgical History:  Procedure Laterality Date  . ABDOMINAL AORTOGRAM W/LOWER EXTREMITY N/A 08/16/2017   Procedure: ABDOMINAL AORTOGRAM W/LOWER EXTREMITY;  Surgeon: Chuck Hint, MD;  Location: [facility] INVASIVE CV LAB;  Service: Cardiovascular;  Laterality: N/A;  . ABDOMINAL HYSTERECTOMY    . AORTA SURGERY     with sent placement  . AORTIC ARCH ANGIOGRAPHY  08/16/2017   Procedure: AORTIC ARCH ANGIOGRAPHY/ INNOMINTE BYPASS ANGIOGRAPHY;  Surgeon: Chuck Hint, MD;  Location: [facility] INVASIVE CV LAB;  Service: Cardiovascular;;  . CAROTID ENDARTERECTOMY Right   . CYSTOCELE REPAIR    . DILATION AND CURETTAGE OF UTERUS    . ENDARTERECTOMY Left 06/25/2017   Procedure: LEFT CAROTID ENDARTERECTOMY;  Surgeon: Chuck Hint, MD;  Location: [facility] OR;  Service: Vascular;  Laterality: Left;  . HEMATOMA EVACUATION Right 12/18/2017   Procedure: EVACUATION HEMATOMA;  Surgeon: Nada Libman, MD;  Location: [facility] OR;  Service: Vascular;  Laterality: Right;  . INSERTION OF RETROGRADE CAROTID STENT Right 12/18/2017  Procedure: INSERTION OF RETROGRADE CAROTID STENT;  Surgeon: Markey Deady E, MD;  Location: [facility] OR;  Service: Vascular;  Laterality: Right;  . PATCH ANGIOPLASTY Left 06/25/2017   Procedure: PATCH ANGIOPLASTY OF LEFT CAROTID ARTERY USING HEMASHIELD PALTINUM FINESSE PATCH;  Surgeon: Dickson, Christopher S, MD;  Location: [facility] OR;  Service: Vascular;  Laterality: Left;  . RECTOCELE REPAIR     x2    Current Outpatient Medications on File Prior to Visit  Medication Sig Dispense Refill  . BD PEN NEEDLE NANO 2ND GEN 32G X 4 MM MISC     .  calcium-vitamin D (OSCAL WITH D) 500-200 MG-UNIT tablet Take 1 tablet by mouth 3 (three) times a week.    . clopidogrel (PLAVIX) 75 MG tablet TAKE 1 TABLET BY MOUTH EVERY DAY (Patient taking differently: Take 75 mg by mouth daily.) 90 tablet 2  . ezetimibe (ZETIA) 10 MG tablet Take 1 tablet (10 mg total) by mouth daily. 90 tablet 3  . Fingerstix Lancets MISC Inject into the skin.    . glucose blood (PRECISION QID TEST) test strip 1 strip by Other route every day    . insulin glargine (LANTUS) 100 UNIT/ML Solostar Pen Inject 32 Units into the skin daily after supper.     No current facility-administered medications on file prior to visit.     Physical exam:  Vitals:   09/01/20 1439  BP: (!) 160/74  Pulse: 95  Resp: 20  Temp: 98.4 F (36.9 C)  SpO2: 96%  Weight: 136 lb 6.4 oz (61.9 kg)  Height: 5' 1" (1.549 m)    Neck: Well-healed right neck scar well-healed left neck scar well-healed sternotomy scar  Neuro: Symmetric upper extremity lower extremity motor strength 5/5 no facial asymmetry  Assessment: Recurrent stenosis right internal carotid artery with greater than 80% stenosis at risk of stroke.  Plan: Patient will continue to try to quit smoking.  She is scheduled for a right TCAR stent Sep 19, 2020.  Risk benefits possible complications and procedure details were discussed with the patient today including not limited to bleeding infection stroke risk of 1% cranial nerve injury risk of 10% she understands and agrees to proceed.  Isabella Winborne, MD [facility] Office: [phone]  

## 2020-09-01 NOTE — H&P (View-Only) (Signed)
Patient is a 55 year old female who returns for follow-up today.  We have previously considered doing a right TCAR on her.  She had concerns about COVID testing and personnel in the operating room on 2 prior occasions.  Apparently the last time she was scheduled the operation was canceled because her directed blood donation was not available.  She now states that she is ready to proceed with right TCAR.  She has had a previous right common carotid stent.  This is well below the clavicle.  This was placed in 2019.  Her left carotid circulation is patent.  She has no significant intracranial stenosis.  This is based on a CT angiogram from September 2021.  She has also had a previous innominate bypass in [place] many years ago.  She has also had a previous right carotid endarterectomy in [place] many years ago and a left carotid endarterectomy mobile partner Dr. Edilia Bo several years ago.  She is on Plavix aspirin and a statin was represcribed today.  It was emphasized to the patient that she will need to be on the statin for at least 6 weeks perirocedure.  She states that she is taking her aspirin and Plavix.  Her most recent carotid duplex scan in February 2022 showed velocities on the right side of 661/269 with a patent right common carotid stent left side was 40 to 60%.  Vertebrals were antegrade.  Past Medical History:  Diagnosis Date  . Anemia   . Arthritis    "lower back, knees, elbows"  . Carotid artery occlusion   . Complication of anesthesia 2013   Patient stated that she could not  "wake up from anesthesia and coded" during aorta surgery in [facility]; by account likely post-op aspiration with respiratory distress/reintubation 11/2011  . COPD (chronic obstructive pulmonary disease) (HCC)    patient states she has been diagnosed with COPD "but does not use any inhalers"  . Coronary artery disease   . Diabetes mellitus without complication (HCC)    Type II  . Fatty liver   . GERD  (gastroesophageal reflux disease)    diet controlled  . History of hiatal hernia   . History of kidney stones   . Peripheral vascular disease (HCC)   . Pneumonia   . Poison sumac    acquired it approx on Friday.  On back, below left shoulder and under breasts  . Stroke [facility]) 2014   , expressive aphisa at times, Left hand cannot close    Past Surgical History:  Procedure Laterality Date  . ABDOMINAL AORTOGRAM W/LOWER EXTREMITY N/A 08/16/2017   Procedure: ABDOMINAL AORTOGRAM W/LOWER EXTREMITY;  Surgeon: Chuck Hint, MD;  Location: [facility] INVASIVE CV LAB;  Service: Cardiovascular;  Laterality: N/A;  . ABDOMINAL HYSTERECTOMY    . AORTA SURGERY     with sent placement  . AORTIC ARCH ANGIOGRAPHY  08/16/2017   Procedure: AORTIC ARCH ANGIOGRAPHY/ INNOMINTE BYPASS ANGIOGRAPHY;  Surgeon: Chuck Hint, MD;  Location: [facility] INVASIVE CV LAB;  Service: Cardiovascular;;  . CAROTID ENDARTERECTOMY Right   . CYSTOCELE REPAIR    . DILATION AND CURETTAGE OF UTERUS    . ENDARTERECTOMY Left 06/25/2017   Procedure: LEFT CAROTID ENDARTERECTOMY;  Surgeon: Chuck Hint, MD;  Location: [facility] OR;  Service: Vascular;  Laterality: Left;  . HEMATOMA EVACUATION Right 12/18/2017   Procedure: EVACUATION HEMATOMA;  Surgeon: Nada Libman, MD;  Location: [facility] OR;  Service: Vascular;  Laterality: Right;  . INSERTION OF RETROGRADE CAROTID STENT Right 12/18/2017  Procedure: INSERTION OF RETROGRADE CAROTID STENT;  Surgeon: Sherren Kerns, MD;  Location: [facility] OR;  Service: Vascular;  Laterality: Right;  . PATCH ANGIOPLASTY Left 06/25/2017   Procedure: PATCH ANGIOPLASTY OF LEFT CAROTID ARTERY USING HEMASHIELD PALTINUM FINESSE PATCH;  Surgeon: Chuck Hint, MD;  Location: [facility] OR;  Service: Vascular;  Laterality: Left;  . RECTOCELE REPAIR     x2    Current Outpatient Medications on File Prior to Visit  Medication Sig Dispense Refill  . BD PEN NEEDLE NANO 2ND GEN 32G X 4 MM MISC     .  calcium-vitamin D (OSCAL WITH D) 500-200 MG-UNIT tablet Take 1 tablet by mouth 3 (three) times a week.    . clopidogrel (PLAVIX) 75 MG tablet TAKE 1 TABLET BY MOUTH EVERY DAY (Patient taking differently: Take 75 mg by mouth daily.) 90 tablet 2  . ezetimibe (ZETIA) 10 MG tablet Take 1 tablet (10 mg total) by mouth daily. 90 tablet 3  . Fingerstix Lancets MISC Inject into the skin.    [facility] glucose blood (PRECISION QID TEST) test strip 1 strip by Other route every day    . insulin glargine (LANTUS) 100 UNIT/ML Solostar Pen Inject 32 Units into the skin daily after supper.     No current facility-administered medications on file prior to visit.     Physical exam:  Vitals:   09/01/20 1439  BP: (!) 160/74  Pulse: 95  Resp: 20  Temp: 98.4 F (36.9 C)  SpO2: 96%  Weight: 136 lb 6.4 oz (61.9 kg)  Height: 5\' 1"  (1.549 m)    Neck: Well-healed right neck scar well-healed left neck scar well-healed sternotomy scar  Neuro: Symmetric upper extremity lower extremity motor strength 5/5 no facial asymmetry  Assessment: Recurrent stenosis right internal carotid artery with greater than 80% stenosis at risk of stroke.  Plan: Patient will continue to try to quit smoking.  She is scheduled for a right TCAR stent Sep 19, 2020.  Risk benefits possible complications and procedure details were discussed with the patient today including not limited to bleeding infection stroke risk of 1% cranial nerve injury risk of 10% she understands and agrees to proceed.  Sep 21, 2020, MD [facility] Office: [phone]

## 2020-09-14 NOTE — Pre-Procedure Instructions (Signed)
Surgical Instructions    Your procedure is scheduled on Monday May 9th.  Report to [facility] Main Entrance "A" at 5:30 A.M., then check in with the Admitting office.  Call this number if you have problems the morning of surgery:  [phone]   If you have any questions prior to your surgery date call [phone]: Open Monday-Friday 8am-4pm    Remember:  Do not eat or drink after midnight the night before your surgery      Take these medicines the morning of surgery with A SIP OF WATER  rosuvastatin (CRESTOR)   clopidogrel (PLAVIX)  Per the instructions in the letter you received from your surgeon,YOU WILL CONTINUE TO TAKE YOUR PLAVIX AND ASPIRIN WITHOUT INTERRUPTION PRIOR TO THE SURGERY.  As of today, STOP taking any Aspirin (unless otherwise instructed by your surgeon) Aleve, Naproxen, Ibuprofen, Motrin, Advil, Goody's, BC's, all herbal medications, fish oil, and all vitamins.   WHAT DO I DO ABOUT MY DIABETES MEDICATION?   Marland Kitchen Do not take oral diabetes medicines (pills) the morning of surgery.  . THE NIGHT BEFORE SURGERY, take 16 units of insulin glargine (Lantus)      . The day of surgery, do not take other diabetes injectables, including Byetta (exenatide), Bydureon (exenatide ER), Victoza (liraglutide), or Trulicity (dulaglutide).  . If your CBG is greater than 220 mg/dL, you may take  of your sliding scale (correction) dose of insulin.   HOW TO MANAGE YOUR DIABETES BEFORE AND AFTER SURGERY  Why is it important to control my blood sugar before and after surgery? . Improving blood sugar levels before and after surgery helps healing and can limit problems. . A way of improving blood sugar control is eating a healthy diet by: o  Eating less sugar and carbohydrates o  Increasing activity/exercise o  Talking with your doctor about reaching your blood sugar goals . High blood sugars (greater than 180 mg/dL) can raise your risk of infections and slow your recovery, so you  will need to focus on controlling your diabetes during the weeks before surgery. . Make sure that the doctor who takes care of your diabetes knows about your planned surgery including the date and location.  How do I manage my blood sugar before surgery? . Check your blood sugar at least 4 times a day, starting 2 days before surgery, to make sure that the level is not too high or low. . Check your blood sugar the morning of your surgery when you wake up and every 2 hours until you get to the Short Stay unit. o If your blood sugar is less than 70 mg/dL, you will need to treat for low blood sugar: - Do not take insulin. - Treat a low blood sugar (less than 70 mg/dL) with  cup of clear juice (cranberry or apple), 4 glucose tablets, OR glucose gel. - Recheck blood sugar in 15 minutes after treatment (to make sure it is greater than 70 mg/dL). If your blood sugar is not greater than 70 mg/dL on recheck, call [phone] for further instructions. . Report your blood sugar to the short stay nurse when you get to Short Stay.  . If you are admitted to the hospital after surgery: o Your blood sugar will be checked by the staff and you will probably be given insulin after surgery (instead of oral diabetes medicines) to make sure you have good blood sugar levels. o The goal for blood sugar control after surgery is 80-180 mg/dL.  Do NOT Smoke (Tobacco/Vaping) or drink Alcohol 24 hours prior to your procedure.  If you use a CPAP at night, you may bring all equipment for your overnight stay.   Contacts, glasses, piercing's, hearing aid's, dentures or partials may not be worn into surgery, please bring cases for these belongings.    For patients admitted to the hospital, discharge time will be determined by your treatment team.   Patients discharged the day of surgery will not be allowed to drive home, and someone needs to stay with them for 24 hours.    Special instructions:    [place]- Preparing For Surgery  Before surgery, you can play an important role. Because skin is not sterile, your skin needs to be as free of germs as possible. You can reduce the number of germs on your skin by washing with CHG (chlorahexidine gluconate) Soap before surgery.  CHG is an antiseptic cleaner which kills germs and bonds with the skin to continue killing germs even after washing.    Oral Hygiene is also important to reduce your risk of infection.  Remember - BRUSH YOUR TEETH THE MORNING OF SURGERY WITH YOUR REGULAR TOOTHPASTE  Please do not use if you have an allergy to CHG or antibacterial soaps. If your skin becomes reddened/irritated stop using the CHG.  Do not shave (including legs and underarms) for at least 48 hours prior to first CHG shower. It is OK to shave your face.  Please follow these instructions carefully.   1. Shower the NIGHT BEFORE SURGERY and the MORNING OF SURGERY  2. If you chose to wash your hair, wash your hair first as usual with your normal shampoo.  3. After you shampoo, rinse your hair and body thoroughly to remove the shampoo.  4. Use CHG Soap as you would any other liquid soap. You can apply CHG directly to the skin and wash gently with a scrungie or a clean washcloth.   5. Apply the CHG Soap to your body ONLY FROM THE NECK DOWN.  Do not use on open wounds or open sores. Avoid contact with your eyes, ears, mouth and genitals (private parts). Wash Face and genitals (private parts)  with your normal soap.   6. Wash thoroughly, paying special attention to the area where your surgery will be performed.  7. Thoroughly rinse your body with warm water from the neck down.  8. DO NOT shower/wash with your normal soap after using and rinsing off the CHG Soap.  9. Pat yourself dry with a CLEAN TOWEL.  10. Wear CLEAN PAJAMAS to bed the night before surgery  11. Place CLEAN SHEETS on your bed the night before your surgery  12. DO NOT SLEEP WITH  PETS.   Day of Surgery: Shower with CHG soap. Do not wear jewelry, make up, or nail polish Do not wear lotions, powders, perfumes, or deodorant. Do not shave 48 hours prior to surgery.   Do not bring valuables to the hospital. [facility] is not responsible for any belongings or valuables. Wear Clean/Comfortable clothing the morning of surgery Remember to brush your teeth WITH YOUR REGULAR TOOTHPASTE.   Please read over the following fact sheets that you were given.

## 2020-09-15 ENCOUNTER — Encounter (HOSPITAL_COMMUNITY)
Admission: RE | Admit: 2020-09-15 | Discharge: 2020-09-15 | Disposition: A | Discharge status: Home / Self Care | Payer: Medicare PPO | Source: Ambulatory Visit | Attending: Vascular Surgery | Admitting: Vascular Surgery

## 2020-09-15 ENCOUNTER — Encounter (HOSPITAL_COMMUNITY): Payer: Self-pay

## 2020-09-15 ENCOUNTER — Other Ambulatory Visit: Payer: Self-pay

## 2020-09-15 DIAGNOSIS — Z20822 Contact with and (suspected) exposure to covid-19: Secondary | ICD-10-CM | POA: Diagnosis not present

## 2020-09-15 DIAGNOSIS — Z01812 Encounter for preprocedural laboratory examination: Secondary | ICD-10-CM | POA: Diagnosis present

## 2020-09-15 HISTORY — DX: Papillomavirus as the cause of diseases classified elsewhere: B97.7

## 2020-09-15 LAB — URINALYSIS, ROUTINE W REFLEX MICROSCOPIC
Bilirubin Urine: NEGATIVE
Glucose, UA: NEGATIVE mg/dL
Hgb urine dipstick: NEGATIVE
Ketones, ur: NEGATIVE mg/dL
Leukocytes,Ua: NEGATIVE
Nitrite: NEGATIVE
Protein, ur: NEGATIVE mg/dL
Specific Gravity, Urine: 1.01 (ref 1.005–1.030)
pH: 5 (ref 5.0–8.0)

## 2020-09-15 LAB — COMPREHENSIVE METABOLIC PANEL
ALT: 18 U/L (ref 0–44)
AST: 17 U/L (ref 15–41)
Albumin: 4.1 g/dL (ref 3.5–5.0)
Alkaline Phosphatase: 77 U/L (ref 38–126)
Anion gap: 7 (ref 5–15)
BUN: 18 mg/dL (ref 6–20)
CO2: 21 mmol/L — ABNORMAL LOW (ref 22–32)
Calcium: 9.2 mg/dL (ref 8.9–10.3)
Chloride: 108 mmol/L (ref 98–111)
Creatinine, Ser: 0.61 mg/dL (ref 0.44–1.00)
GFR, Estimated: >60 mL/min (ref >60)
Glucose, Bld: 102 mg/dL — ABNORMAL HIGH (ref 70–99)
Potassium: 4.2 mmol/L (ref 3.5–5.1)
Sodium: 136 mmol/L (ref 135–145)
Total Bilirubin: 0.5 mg/dL (ref 0.3–1.2)
Total Protein: 7.3 g/dL (ref 6.5–8.1)

## 2020-09-15 LAB — CBC
HCT: 43.5 % (ref 36.0–46.0)
Hemoglobin: 14.5 g/dL (ref 12.0–15.0)
MCH: 30.7 pg (ref 26.0–34.0)
MCHC: 33.3 g/dL (ref 30.0–36.0)
MCV: 92.2 fL (ref 80.0–100.0)
Platelets: 335 10*3/uL (ref 150–400)
RBC: 4.72 MIL/uL (ref 3.87–5.11)
RDW: 13.6 % (ref 11.5–15.5)
WBC: 9.9 10*3/uL (ref 4.0–10.5)
nRBC: 0 % (ref 0.0–0.2)

## 2020-09-15 LAB — HEMOGLOBIN A1C
Hgb A1c MFr Bld: 7 % — ABNORMAL HIGH (ref 4.8–5.6)
Mean Plasma Glucose: 154.2 mg/dL

## 2020-09-15 LAB — PROTIME-INR
INR: 1 (ref 0.8–1.2)
Prothrombin Time: 12.7 seconds (ref 11.4–15.2)

## 2020-09-15 LAB — SARS CORONAVIRUS 2 (TAT 6-24 HRS): SARS Coronavirus 2: NEGATIVE

## 2020-09-15 LAB — APTT: aPTT: 26 seconds (ref 24–36)

## 2020-09-15 LAB — GLUCOSE, CAPILLARY: Glucose-Capillary: 137 mg/dL — ABNORMAL HIGH (ref 70–99)

## 2020-09-15 LAB — SURGICAL PCR SCREEN
MRSA, PCR: NEGATIVE
Staphylococcus aureus: NEGATIVE

## 2020-09-15 NOTE — Pre-Procedure Instructions (Signed)
Surgical Instructions    Your procedure is scheduled on Monday May 9th.  Report to [facility] Main Entrance "A" at 5:30 A.M., then check in with the Admitting office.  Call this number if you have problems the morning of surgery:  [phone]   If you have any questions prior to your surgery date call [phone]: Open Monday-Friday 8am-4pm    Remember:  Do not eat or drink after midnight the night before your surgery      Take these medicines the morning of surgery with A SIP OF WATER  rosuvastatin (CRESTOR)   ezetimibe (ZETIA)   clopidogrel (PLAVIX)   Per the instructions in the letter you received from your surgeon,YOU WILL CONTINUE TO TAKE YOUR PLAVIX AND ASPIRIN WITHOUT INTERRUPTION PRIOR TO THE SURGERY.  As of today, STOP taking any Aspirin (unless otherwise instructed by your surgeon) Aleve, Naproxen, Ibuprofen, Motrin, Advil, Goody's, BC's, all herbal medications, fish oil, and all vitamins.   WHAT DO I DO ABOUT MY DIABETES MEDICATION?   . THE NIGHT BEFORE SURGERY, take 16 units of insulin glargine (Lantus)        HOW TO MANAGE YOUR DIABETES BEFORE AND AFTER SURGERY  Why is it important to control my blood sugar before and after surgery? . Improving blood sugar levels before and after surgery helps healing and can limit problems. . A way of improving blood sugar control is eating a healthy diet by: o  Eating less sugar and carbohydrates o  Increasing activity/exercise o  Talking with your doctor about reaching your blood sugar goals . High blood sugars (greater than 180 mg/dL) can raise your risk of infections and slow your recovery, so you will need to focus on controlling your diabetes during the weeks before surgery. . Make sure that the doctor who takes care of your diabetes knows about your planned surgery including the date and location.  How do I manage my blood sugar before surgery? . Check your blood sugar at least 4 times a day, starting 2 days  before surgery, to make sure that the level is not too high or low. . Check your blood sugar the morning of your surgery when you wake up and every 2 hours until you get to the Short Stay unit. o If your blood sugar is less than 70 mg/dL, you will need to treat for low blood sugar: - Do not take insulin. - Treat a low blood sugar (less than 70 mg/dL) with  cup of clear juice (cranberry or apple), 4 glucose tablets, OR glucose gel. - Recheck blood sugar in 15 minutes after treatment (to make sure it is greater than 70 mg/dL). If your blood sugar is not greater than 70 mg/dL on recheck, call [phone] for further instructions. . Report your blood sugar to the short stay nurse when you get to Short Stay.  . If you are admitted to the hospital after surgery: o Your blood sugar will be checked by the staff and you will probably be given insulin after surgery (instead of oral diabetes medicines) to make sure you have good blood sugar levels. o The goal for blood sugar control after surgery is 80-180 mg/dL.                     Do NOT Smoke (Tobacco/Vaping) or drink Alcohol 24 hours prior to your procedure.  If you use a CPAP at night, you may bring all equipment for your overnight stay.   Contacts, glasses, piercing's,  hearing aid's, dentures or partials may not be worn into surgery, please bring cases for these belongings.    For patients admitted to the hospital, discharge time will be determined by your treatment team.   Patients discharged the day of surgery will not be allowed to drive home, and someone needs to stay with them for 24 hours.    Special instructions:   [place]- Preparing For Surgery  Before surgery, you can play an important role. Because skin is not sterile, your skin needs to be as free of germs as possible. You can reduce the number of germs on your skin by washing with CHG (chlorahexidine gluconate) Soap before surgery.  CHG is an antiseptic cleaner which kills  germs and bonds with the skin to continue killing germs even after washing.    Oral Hygiene is also important to reduce your risk of infection.  Remember - BRUSH YOUR TEETH THE MORNING OF SURGERY WITH YOUR REGULAR TOOTHPASTE  Please do not use if you have an allergy to CHG or antibacterial soaps. If your skin becomes reddened/irritated stop using the CHG.  Do not shave (including legs and underarms) for at least 48 hours prior to first CHG shower. It is OK to shave your face.  Please follow these instructions carefully.   1. Shower the NIGHT BEFORE SURGERY and the MORNING OF SURGERY  2. If you chose to wash your hair, wash your hair first as usual with your normal shampoo.  3. After you shampoo, rinse your hair and body thoroughly to remove the shampoo.  4. Use CHG Soap as you would any other liquid soap. You can apply CHG directly to the skin and wash gently with a scrungie or a clean washcloth.   5. Apply the CHG Soap to your body ONLY FROM THE NECK DOWN.  Do not use on open wounds or open sores. Avoid contact with your eyes, ears, mouth and genitals (private parts). Wash Face and genitals (private parts)  with your normal soap.   6. Wash thoroughly, paying special attention to the area where your surgery will be performed.  7. Thoroughly rinse your body with warm water from the neck down.  8. DO NOT shower/wash with your normal soap after using and rinsing off the CHG Soap.  9. Pat yourself dry with a CLEAN TOWEL.  10. Wear CLEAN PAJAMAS to bed the night before surgery  11. Place CLEAN SHEETS on your bed the night before your surgery  12. DO NOT SLEEP WITH PETS.   Day of Surgery: Shower with CHG soap. Do not wear jewelry, make up, or nail polish Do not wear lotions, powders, perfumes, or deodorant. Do not shave 48 hours prior to surgery.   Do not bring valuables to the hospital. [facility] is not responsible for any belongings or valuables. Wear Clean/Comfortable clothing  the morning of surgery Remember to brush your teeth WITH YOUR REGULAR TOOTHPASTE.   Please read over the following fact sheets that you were given.

## 2020-09-15 NOTE — Progress Notes (Signed)
PCP - [name], MD Cardiologist - Laurance Flatten, MD  PPM/ICD - Denies  Chest x-ray - N/A EKG - 07/15/2020 Stress Test - 07/29/2020 ECHO - 01/09/2017 Cardiac Cath - Denies  Sleep Study -Denied  Fasting Blood Sugar - Pt sates has nor been able to get CBGs due to wrong order of strips.  Checks Blood Sugar __0_ times a day  Blood Thinner Instructions: Per MD to continue taking ASA and Plavix w/o interruption Aspirin Instructions: See above  ERAS Protcol -N/A PRE-SURGERY Ensure or G2- N/A  COVID TEST- 09/15/2020   Anesthesia review: YES, cardiac hx.  Patient denies shortness of breath, fever, cough and chest pain at PAT appointment   All instructions explained to the patient, with a verbal understanding of the material. Patient agrees to go over the instructions while at home for a better understanding. Patient also instructed to self quarantine after being tested for COVID-19. The opportunity to ask questions was provided.

## 2020-09-16 LAB — GLUCOSE, CAPILLARY: Glucose-Capillary: 40 mg/dL — CL (ref 70–99)

## 2020-09-16 NOTE — Anesthesia Preprocedure Evaluation (Addendum)
Anesthesia Evaluation  Patient identified by MRN, date of birth, ID band Patient awake    Reviewed: Allergy & Precautions, NPO status , Patient's Chart, lab work & pertinent test results  History of Anesthesia Complications (+) history of anesthetic complications  Airway Mallampati: II  TM Distance: >3 FB Neck ROM: Full    Dental  (+) Dental Advisory Given, Partial Upper, Missing, Chipped, Poor Dentition   Pulmonary pneumonia, COPD, Current Smoker and Patient abstained from smoking.,    Pulmonary exam normal breath sounds clear to auscultation       Cardiovascular hypertension, + CAD and + Peripheral Vascular Disease  Normal cardiovascular exam Rhythm:Regular Rate:Normal     Neuro/Psych CVA, No Residual Symptoms    GI/Hepatic Neg liver ROS, hiatal hernia, GERD  ,  Endo/Other  negative endocrine ROSdiabetes  Renal/GU Renal disease     Musculoskeletal  (+) Arthritis ,   Abdominal   Peds  Hematology negative hematology ROS (+) anemia ,   Anesthesia Other Findings   Reproductive/Obstetrics                          Anesthesia Physical Anesthesia Plan  ASA: III  Anesthesia Plan: General   Post-op Pain Management:    Induction: Intravenous  PONV Risk Score and Plan: 3 and Ondansetron, Dexamethasone, Treatment may vary due to age or medical condition and Midazolam  Airway Management Planned: Oral ETT  Additional Equipment: Arterial line  Intra-op Plan:   Post-operative Plan: Extubation in OR  Informed Consent: I have reviewed the patients History and Physical, chart, labs and discussed the procedure including the risks, benefits and alternatives for the proposed anesthesia with the patient or authorized representative who has indicated his/her understanding and acceptance.     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  She was referred to cardiologist Dr. Shari Prows by Dr. Darrick Penna for preoperative evaluation prior to right carotid artery stenting.  Per note 07/15/2020, "Patient with history of bilateral CAS s/p bilateral endarterectomy (at different times) with restenosis of the right internal carotid s/p stenting in 2019 now with symptomatic re-stenosis planned for re-intervention. Patient is limited in exercise capacity due to dizziness she develops from CAS as well as shortness of breath. Will check lexiscan as patient is very high risk for CAD..  If low to intermediate risk, no further testing prior to surgery."  Lexiscan 08/04/2020 was normal, low risk.  Per surgery posting patient to continue aspirin and Plavix.  Patient has made an autologous blood donation prior to surgery.  Preop labs reviewed, unremarkable.  A1c 7.0.  EKG 07/15/2020: NSR.  Rate 88.  Nuclear stress 08/04/2020: There was no ST segment deviation noted during stress. The left ventricular ejection fraction is normal (55-65%). Nuclear stress EF: 61%. The study is normal. This is a low risk study.   1. Fixed perfusion defect at apex with normal wall motion, consistent with artifact 2. Low risk study  Carotid Duplex 07/07/20: Summary:  Right Carotid: Velocities in the right ICA are consistent with a 80-99% stenosis. Patent stent with increased velocity throughout with velocity in the 50 - 75% stenosis range.   Left Carotid: Patent left carotid endarterectomy with velocity in the left ICA consistent with a  40-59% stenosis.   )       Anesthesia Quick Evaluation                                  Anesthesia Evaluation  Patient identified by MRN, date of birth, ID band Patient awake    Reviewed: Allergy & Precautions, NPO  status , Patient's Chart, lab work & pertinent test results  Airway Mallampati: II  TM Distance: >3 FB Neck ROM: Full    Dental  (+) Dental Advisory Given   Pulmonary pneumonia, COPD, Current Smoker,    breath sounds clear to auscultation       Cardiovascular + CAD and + Peripheral Vascular Disease   Rhythm:Regular Rate:Normal     Neuro/Psych    GI/Hepatic Neg liver ROS, hiatal hernia, GERD  ,  Endo/Other  diabetes  Renal/GU negative Renal ROS     Musculoskeletal  (+) Arthritis ,   Abdominal   Peds  Hematology  (+) anemia ,   Anesthesia Other Findings   Reproductive/Obstetrics                             Anesthesia Physical Anesthesia Plan  ASA: III and emergent  Anesthesia Plan: General   Post-op Pain Management:    Induction: Intravenous  PONV Risk Score and Plan: 2 and Ondansetron and Dexamethasone  Airway Management Planned: Oral ETT  Additional Equipment: Arterial line  Intra-op Plan:   Post-operative Plan: Extubation in OR  Informed Consent: I have reviewed the patients History and Physical, chart, labs and discussed the procedure including the risks, benefits and alternatives for the proposed anesthesia with the patient or authorized representative who has indicated his/her understanding and acceptance.   Dental advisory given  Plan Discussed with: CRNA and Surgeon  Anesthesia Plan Comments:         Anesthesia Quick Evaluation

## 2020-09-16 NOTE — Progress Notes (Signed)
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  She was referred to cardiologist Dr. Shari Prows by Dr. Darrick Penna for preoperative evaluation prior to right carotid artery stenting.  Per note 07/15/2020, "Patient with history of bilateral CAS s/p bilateral endarterectomy (at different times) with restenosis of the right internal carotid s/p stenting in 2019 now with symptomatic re-stenosis planned for re-intervention. Patient is limited in exercise capacity due to dizziness she develops from CAS as well as shortness of breath. Will check lexiscan as patient is very high risk for CAD..  If low to intermediate risk, no further testing prior to surgery."  Lexiscan 08/04/2020 was normal, low risk.  Per surgery posting patient to continue aspirin and Plavix.  Patient has made an autologous blood donation prior to surgery.  Preop labs reviewed, unremarkable.  A1c 7.0.  EKG 07/15/2020: NSR.  Rate 88.  Nuclear stress 08/04/2020:  There was no ST segment deviation noted during stress.  The left ventricular ejection fraction is normal (55-65%).  Nuclear stress EF: 61%.  The study is normal.  This is a low risk study.   1. Fixed perfusion defect at apex with normal wall motion, consistent with artifact 2. Low risk study  Carotid Duplex 07/07/20: Summary:  Right Carotid: Velocities in the right ICA are consistent with a 80-99% stenosis. Patent stent with increased velocity throughout with velocity in the 50 - 75% stenosis range.   Left Carotid: Patent left carotid endarterectomy with velocity in the left ICA consistent with a 40-59% stenosis.    Zannie [facility] Short Stay Center/Anesthesiology Phone [phone] 09/16/2020 9:53 AM

## 2020-09-19 ENCOUNTER — Inpatient Hospital Stay (HOSPITAL_COMMUNITY)
Admission: RE | Admit: 2020-09-19 | Discharge: 2020-09-21 | DRG: 036 | Disposition: A | Discharge status: Home / Self Care | Payer: Medicare PPO | Attending: Vascular Surgery | Admitting: Vascular Surgery

## 2020-09-19 ENCOUNTER — Inpatient Hospital Stay (HOSPITAL_COMMUNITY): Payer: Medicare PPO | Admitting: Physician Assistant

## 2020-09-19 ENCOUNTER — Other Ambulatory Visit: Payer: Self-pay

## 2020-09-19 ENCOUNTER — Inpatient Hospital Stay (HOSPITAL_COMMUNITY): Payer: Medicare PPO | Admitting: Anesthesiology

## 2020-09-19 ENCOUNTER — Encounter (HOSPITAL_COMMUNITY): Payer: Self-pay | Admitting: Vascular Surgery

## 2020-09-19 ENCOUNTER — Inpatient Hospital Stay (HOSPITAL_COMMUNITY): Payer: Medicare PPO

## 2020-09-19 ENCOUNTER — Encounter (HOSPITAL_COMMUNITY)
Admission: RE | Disposition: A | Discharge status: Home / Self Care | Payer: Self-pay | Source: Home / Self Care | Attending: Vascular Surgery

## 2020-09-19 DIAGNOSIS — I129 Hypertensive chronic kidney disease with stage 1 through stage 4 chronic kidney disease, or unspecified chronic kidney disease: Secondary | ICD-10-CM | POA: Diagnosis present

## 2020-09-19 DIAGNOSIS — I2510 Atherosclerotic heart disease of native coronary artery without angina pectoris: Secondary | ICD-10-CM | POA: Diagnosis present

## 2020-09-19 DIAGNOSIS — I6529 Occlusion and stenosis of unspecified carotid artery: Secondary | ICD-10-CM | POA: Diagnosis present

## 2020-09-19 DIAGNOSIS — K760 Fatty (change of) liver, not elsewhere classified: Secondary | ICD-10-CM | POA: Diagnosis present

## 2020-09-19 DIAGNOSIS — Z8673 Personal history of transient ischemic attack (TIA), and cerebral infarction without residual deficits: Secondary | ICD-10-CM | POA: Diagnosis not present

## 2020-09-19 DIAGNOSIS — Z7902 Long term (current) use of antithrombotics/antiplatelets: Secondary | ICD-10-CM

## 2020-09-19 DIAGNOSIS — Z888 Allergy status to other drugs, medicaments and biological substances status: Secondary | ICD-10-CM | POA: Diagnosis not present

## 2020-09-19 DIAGNOSIS — J449 Chronic obstructive pulmonary disease, unspecified: Secondary | ICD-10-CM | POA: Diagnosis present

## 2020-09-19 DIAGNOSIS — Z79899 Other long term (current) drug therapy: Secondary | ICD-10-CM

## 2020-09-19 DIAGNOSIS — I6523 Occlusion and stenosis of bilateral carotid arteries: Secondary | ICD-10-CM

## 2020-09-19 DIAGNOSIS — E1151 Type 2 diabetes mellitus with diabetic peripheral angiopathy without gangrene: Secondary | ICD-10-CM | POA: Diagnosis present

## 2020-09-19 DIAGNOSIS — F17210 Nicotine dependence, cigarettes, uncomplicated: Secondary | ICD-10-CM | POA: Diagnosis present

## 2020-09-19 DIAGNOSIS — I6521 Occlusion and stenosis of right carotid artery: Secondary | ICD-10-CM | POA: Diagnosis present

## 2020-09-19 DIAGNOSIS — N182 Chronic kidney disease, stage 2 (mild): Secondary | ICD-10-CM | POA: Diagnosis present

## 2020-09-19 DIAGNOSIS — Z7982 Long term (current) use of aspirin: Secondary | ICD-10-CM | POA: Diagnosis not present

## 2020-09-19 DIAGNOSIS — K219 Gastro-esophageal reflux disease without esophagitis: Secondary | ICD-10-CM | POA: Diagnosis present

## 2020-09-19 DIAGNOSIS — E1122 Type 2 diabetes mellitus with diabetic chronic kidney disease: Secondary | ICD-10-CM | POA: Diagnosis present

## 2020-09-19 DIAGNOSIS — Z794 Long term (current) use of insulin: Secondary | ICD-10-CM | POA: Diagnosis not present

## 2020-09-19 HISTORY — PX: TRANSCAROTID ARTERY REVASCULARIZATIONÂ: SHX6778

## 2020-09-19 LAB — CREATININE, SERUM
Creatinine, Ser: 0.65 mg/dL (ref 0.44–1.00)
GFR, Estimated: >60 mL/min (ref >60)

## 2020-09-19 LAB — GLUCOSE, CAPILLARY
Glucose-Capillary: 136 mg/dL — ABNORMAL HIGH (ref 70–99)
Glucose-Capillary: 127 mg/dL — ABNORMAL HIGH (ref 70–99)
Glucose-Capillary: 117 mg/dL — ABNORMAL HIGH (ref 70–99)
Glucose-Capillary: 149 mg/dL — ABNORMAL HIGH (ref 70–99)
Glucose-Capillary: 239 mg/dL — ABNORMAL HIGH (ref 70–99)

## 2020-09-19 LAB — CBC
HCT: 39.8 % (ref 36.0–46.0)
Hemoglobin: 13.2 g/dL (ref 12.0–15.0)
MCH: 30.3 pg (ref 26.0–34.0)
MCHC: 33.2 g/dL (ref 30.0–36.0)
MCV: 91.5 fL (ref 80.0–100.0)
Platelets: 256 10*3/uL (ref 150–400)
RBC: 4.35 MIL/uL (ref 3.87–5.11)
RDW: 13.3 % (ref 11.5–15.5)
WBC: 13 10*3/uL — ABNORMAL HIGH (ref 4.0–10.5)
nRBC: 0 % (ref 0.0–0.2)

## 2020-09-19 LAB — POCT ACTIVATED CLOTTING TIME: Activated Clotting Time: 261 seconds

## 2020-09-19 SURGERY — TRANSCAROTID ARTERY REVASCULARIZATION (TCAR)
Anesthesia: General | Site: Neck | Laterality: Right

## 2020-09-19 MED ORDER — PROPOFOL 1000 MG/100ML IV EMUL
INTRAVENOUS | Status: AC
  Filled 2020-09-19: qty 100

## 2020-09-19 MED ORDER — FENTANYL CITRATE (PF) 250 MCG/5ML IJ SOLN
INTRAMUSCULAR | Status: AC
  Filled 2020-09-19: qty 5

## 2020-09-19 MED ORDER — CHLORHEXIDINE GLUCONATE 0.12 % MT SOLN
15.0000 mL | Freq: Once | OROMUCOSAL | Status: AC
  Administered 2020-09-19: 15 mL via OROMUCOSAL
  Filled 2020-09-19: qty 15

## 2020-09-19 MED ORDER — OXYCODONE-ACETAMINOPHEN 5-325 MG PO TABS
1.0000 | ORAL_TABLET | ORAL | Status: DC | PRN
  Administered 2020-09-19 – 2020-09-21 (×7): 2 via ORAL
  Filled 2020-09-19 (×7): qty 2

## 2020-09-19 MED ORDER — 0.9 % SODIUM CHLORIDE (POUR BTL) OPTIME
TOPICAL | Status: DC | PRN
  Administered 2020-09-19: 1000 mL

## 2020-09-19 MED ORDER — SODIUM CHLORIDE 0.9 % IV SOLN
INTRAVENOUS | Status: AC
  Filled 2020-09-19: qty 1.2

## 2020-09-19 MED ORDER — CHLORHEXIDINE GLUCONATE CLOTH 2 % EX PADS: 6.0000 | MEDICATED_PAD | Freq: Once | CUTANEOUS | Status: DC

## 2020-09-19 MED ORDER — DEXAMETHASONE SODIUM PHOSPHATE 10 MG/ML IJ SOLN
INTRAMUSCULAR | Status: DC | PRN
  Administered 2020-09-19: 4 mg via INTRAVENOUS

## 2020-09-19 MED ORDER — CEFAZOLIN SODIUM-DEXTROSE 2-4 GM/100ML-% IV SOLN
2.0000 g | Freq: Three times a day (TID) | INTRAVENOUS | Status: AC
  Administered 2020-09-19 – 2020-09-20 (×2): 2 g via INTRAVENOUS
  Filled 2020-09-19 (×2): qty 100

## 2020-09-19 MED ORDER — MAGNESIUM SULFATE 2 GM/50ML IV SOLN
2.0000 g | Freq: Every day | INTRAVENOUS | Status: DC | PRN
Start: 2020-09-19 — End: 2020-09-21

## 2020-09-19 MED ORDER — PROPOFOL 10 MG/ML IV BOLUS
INTRAVENOUS | Status: AC
  Filled 2020-09-19: qty 20

## 2020-09-19 MED ORDER — PHENOL 1.4 % MT LIQD: 1.0000 | OROMUCOSAL | Status: DC | PRN

## 2020-09-19 MED ORDER — GUAIFENESIN-DM 100-10 MG/5ML PO SYRP: 15.0000 mL | ORAL_SOLUTION | ORAL | Status: DC | PRN

## 2020-09-19 MED ORDER — DEXAMETHASONE SODIUM PHOSPHATE 10 MG/ML IJ SOLN
INTRAMUSCULAR | Status: AC
  Filled 2020-09-19: qty 1

## 2020-09-19 MED ORDER — SENNOSIDES-DOCUSATE SODIUM 8.6-50 MG PO TABS: 1.0000 | ORAL_TABLET | Freq: Every evening | ORAL | Status: DC | PRN

## 2020-09-19 MED ORDER — POTASSIUM CHLORIDE CRYS ER 20 MEQ PO TBCR: 20.0000 meq | EXTENDED_RELEASE_TABLET | Freq: Every day | ORAL | Status: DC | PRN

## 2020-09-19 MED ORDER — METOPROLOL TARTRATE 5 MG/5ML IV SOLN: 2.0000 mg | INTRAVENOUS | Status: DC | PRN

## 2020-09-19 MED ORDER — PROPOFOL 10 MG/ML IV BOLUS
INTRAVENOUS | Status: DC | PRN
  Administered 2020-09-19: 130 mg via INTRAVENOUS

## 2020-09-19 MED ORDER — HEPARIN SODIUM (PORCINE) 1000 UNIT/ML IJ SOLN
INTRAMUSCULAR | Status: DC | PRN
  Administered 2020-09-19: 6000 [IU] via INTRAVENOUS

## 2020-09-19 MED ORDER — PANTOPRAZOLE SODIUM 40 MG PO TBEC
40.0000 mg | DELAYED_RELEASE_TABLET | Freq: Every day | ORAL | Status: DC
  Administered 2020-09-19 – 2020-09-21 (×3): 40 mg via ORAL
  Filled 2020-09-19 (×3): qty 1

## 2020-09-19 MED ORDER — SODIUM CHLORIDE 0.9 % IV SOLN: INTRAVENOUS | Status: DC

## 2020-09-19 MED ORDER — PHENYLEPHRINE 40 MCG/ML (10ML) SYRINGE FOR IV PUSH (FOR BLOOD PRESSURE SUPPORT)
PREFILLED_SYRINGE | INTRAVENOUS | Status: DC | PRN
  Administered 2020-09-19 (×2): 80 ug via INTRAVENOUS

## 2020-09-19 MED ORDER — SUGAMMADEX SODIUM 200 MG/2ML IV SOLN
INTRAVENOUS | Status: DC | PRN
  Administered 2020-09-19: 150 mg via INTRAVENOUS

## 2020-09-19 MED ORDER — ONDANSETRON HCL 4 MG/2ML IJ SOLN: 4.0000 mg | Freq: Four times a day (QID) | INTRAMUSCULAR | Status: DC | PRN

## 2020-09-19 MED ORDER — FENTANYL CITRATE (PF) 100 MCG/2ML IJ SOLN
INTRAMUSCULAR | Status: DC | PRN
  Administered 2020-09-19: 100 ug via INTRAVENOUS
  Administered 2020-09-19: 25 ug via INTRAVENOUS

## 2020-09-19 MED ORDER — EPINEPHRINE 1 MG/10ML IJ SOSY
PREFILLED_SYRINGE | INTRAMUSCULAR | Status: AC
  Filled 2020-09-19: qty 10

## 2020-09-19 MED ORDER — ALUM & MAG HYDROXIDE-SIMETH 200-200-20 MG/5ML PO SUSP: 15.0000 mL | ORAL | Status: DC | PRN

## 2020-09-19 MED ORDER — ACETAMINOPHEN 325 MG PO TABS: 325.0000 mg | ORAL_TABLET | ORAL | Status: DC | PRN

## 2020-09-19 MED ORDER — ASPIRIN EC 81 MG PO TBEC
81.0000 mg | DELAYED_RELEASE_TABLET | Freq: Every day | ORAL | Status: DC
  Administered 2020-09-20 – 2020-09-21 (×2): 81 mg via ORAL
  Filled 2020-09-19 (×2): qty 1

## 2020-09-19 MED ORDER — FENTANYL CITRATE (PF) 100 MCG/2ML IJ SOLN
25.0000 ug | INTRAMUSCULAR | Status: DC | PRN
  Administered 2020-09-19 (×2): 50 ug via INTRAVENOUS

## 2020-09-19 MED ORDER — ROCURONIUM BROMIDE 10 MG/ML (PF) SYRINGE
PREFILLED_SYRINGE | INTRAVENOUS | Status: AC
  Filled 2020-09-19: qty 10

## 2020-09-19 MED ORDER — BISACODYL 5 MG PO TBEC: 5.0000 mg | DELAYED_RELEASE_TABLET | Freq: Every day | ORAL | Status: DC | PRN

## 2020-09-19 MED ORDER — SODIUM CHLORIDE 0.9 % IV SOLN: 500.0000 mL | Freq: Once | INTRAVENOUS | Status: DC | PRN

## 2020-09-19 MED ORDER — HYDRALAZINE HCL 20 MG/ML IJ SOLN: 5.0000 mg | INTRAMUSCULAR | Status: DC | PRN

## 2020-09-19 MED ORDER — IODIXANOL 320 MG/ML IV SOLN
INTRAVENOUS | Status: DC | PRN
  Administered 2020-09-19: 35 mL

## 2020-09-19 MED ORDER — ONDANSETRON HCL 4 MG/2ML IJ SOLN
INTRAMUSCULAR | Status: AC
  Filled 2020-09-19: qty 2

## 2020-09-19 MED ORDER — FENTANYL CITRATE (PF) 100 MCG/2ML IJ SOLN
INTRAMUSCULAR | Status: AC
  Filled 2020-09-19: qty 2

## 2020-09-19 MED ORDER — ROCURONIUM BROMIDE 10 MG/ML (PF) SYRINGE
PREFILLED_SYRINGE | INTRAVENOUS | Status: DC | PRN
  Administered 2020-09-19: 20 mg via INTRAVENOUS
  Administered 2020-09-19: 50 mg via INTRAVENOUS

## 2020-09-19 MED ORDER — LABETALOL HCL 5 MG/ML IV SOLN: 10.0000 mg | INTRAVENOUS | Status: DC | PRN

## 2020-09-19 MED ORDER — SODIUM CHLORIDE 0.9 % IV SOLN
INTRAVENOUS | Status: DC | PRN
  Administered 2020-09-19: 500 mL

## 2020-09-19 MED ORDER — DOCUSATE SODIUM 100 MG PO CAPS
100.0000 mg | ORAL_CAPSULE | Freq: Every day | ORAL | Status: DC
  Administered 2020-09-20 – 2020-09-21 (×2): 100 mg via ORAL
  Filled 2020-09-19 (×3): qty 1

## 2020-09-19 MED ORDER — LACTATED RINGERS IV SOLN: INTRAVENOUS | Status: DC

## 2020-09-19 MED ORDER — ONDANSETRON HCL 4 MG/2ML IJ SOLN
INTRAMUSCULAR | Status: DC | PRN
  Administered 2020-09-19: 4 mg via INTRAVENOUS

## 2020-09-19 MED ORDER — PROTAMINE SULFATE 10 MG/ML IV SOLN
INTRAVENOUS | Status: AC
  Filled 2020-09-19: qty 5

## 2020-09-19 MED ORDER — INSULIN GLARGINE 100 UNIT/ML ~~LOC~~ SOLN
32.0000 [IU] | Freq: Every day | SUBCUTANEOUS | Status: DC
  Administered 2020-09-19 – 2020-09-20 (×2): 32 [IU] via SUBCUTANEOUS
  Filled 2020-09-19 (×3): qty 0.32

## 2020-09-19 MED ORDER — HEPARIN SODIUM (PORCINE) 1000 UNIT/ML IJ SOLN
INTRAMUSCULAR | Status: AC
  Filled 2020-09-19: qty 1

## 2020-09-19 MED ORDER — LIDOCAINE 2% (20 MG/ML) 5 ML SYRINGE
INTRAMUSCULAR | Status: DC | PRN
  Administered 2020-09-19: 60 mg via INTRAVENOUS

## 2020-09-19 MED ORDER — PROMETHAZINE HCL 25 MG/ML IJ SOLN: 6.2500 mg | INTRAMUSCULAR | Status: DC | PRN

## 2020-09-19 MED ORDER — HYDROMORPHONE HCL 1 MG/ML IJ SOLN
0.5000 mg | INTRAMUSCULAR | Status: DC | PRN
  Administered 2020-09-19: 1 mg via INTRAVENOUS
  Filled 2020-09-19: qty 1

## 2020-09-19 MED ORDER — PROTAMINE SULFATE 10 MG/ML IV SOLN
INTRAVENOUS | Status: AC
  Filled 2020-09-19: qty 25

## 2020-09-19 MED ORDER — INSULIN GLARGINE 100 UNIT/ML SOLOSTAR PEN: 32.0000 [IU] | PEN_INJECTOR | Freq: Every day | SUBCUTANEOUS | Status: DC

## 2020-09-19 MED ORDER — PROTAMINE SULFATE 10 MG/ML IV SOLN
INTRAVENOUS | Status: DC | PRN
  Administered 2020-09-19: 20 mg via INTRAVENOUS
  Administered 2020-09-19 (×2): 10 mg via INTRAVENOUS
  Administered 2020-09-19: 20 mg via INTRAVENOUS

## 2020-09-19 MED ORDER — HEPARIN SODIUM (PORCINE) 5000 UNIT/ML IJ SOLN
5000.0000 [IU] | Freq: Three times a day (TID) | INTRAMUSCULAR | Status: DC
  Administered 2020-09-20 (×2): 5000 [IU] via SUBCUTANEOUS
  Filled 2020-09-19 (×2): qty 1

## 2020-09-19 MED ORDER — LIDOCAINE 2% (20 MG/ML) 5 ML SYRINGE
INTRAMUSCULAR | Status: AC
  Filled 2020-09-19: qty 5

## 2020-09-19 MED ORDER — ORAL CARE MOUTH RINSE: 15.0000 mL | Freq: Once | OROMUCOSAL | Status: AC

## 2020-09-19 MED ORDER — PHENYLEPHRINE HCL-NACL 10-0.9 MG/250ML-% IV SOLN
INTRAVENOUS | Status: DC | PRN
  Administered 2020-09-19: 25 ug/min via INTRAVENOUS

## 2020-09-19 MED ORDER — MEPERIDINE HCL 25 MG/ML IJ SOLN: 6.2500 mg | INTRAMUSCULAR | Status: DC | PRN

## 2020-09-19 MED ORDER — CEFAZOLIN SODIUM-DEXTROSE 2-4 GM/100ML-% IV SOLN
2.0000 g | INTRAVENOUS | Status: AC
  Administered 2020-09-19: 2 g via INTRAVENOUS
  Filled 2020-09-19: qty 100

## 2020-09-19 MED ORDER — ACETAMINOPHEN 650 MG RE SUPP
325.0000 mg | RECTAL | Status: DC | PRN
Start: 2020-09-19 — End: 2020-09-21

## 2020-09-19 MED ORDER — GLYCOPYRROLATE PF 0.2 MG/ML IJ SOSY
PREFILLED_SYRINGE | INTRAMUSCULAR | Status: DC | PRN
  Administered 2020-09-19: .2 mg via INTRAVENOUS

## 2020-09-19 SURGICAL SUPPLY — 61 items
BAG BANDED W/RUBBER/TAPE 36X54 (MISCELLANEOUS) ×2 IMPLANT
BALLN STERLING RX 4X30X80 (BALLOONS) ×2
BALLOON STERLING RX 4X30X80 (BALLOONS) ×1 IMPLANT
CANISTER SUCT 3000ML PPV (MISCELLANEOUS) ×2 IMPLANT
CANNULA VESSEL 3MM 2 BLNT TIP (CANNULA) ×2 IMPLANT
CATH ROBINSON RED A/P 18FR (CATHETERS) IMPLANT
CLIP VESOCCLUDE MED 6/CT (CLIP) ×2 IMPLANT
CLIP VESOCCLUDE SM WIDE 6/CT (CLIP) ×2 IMPLANT
COVER DOME SNAP 22 D (MISCELLANEOUS) ×2 IMPLANT
COVER PROBE W GEL 5X96 (DRAPES) ×2 IMPLANT
COVER WAND RF STERILE (DRAPES) ×2 IMPLANT
DECANTER SPIKE VIAL GLASS SM (MISCELLANEOUS) IMPLANT
DERMABOND ADVANCED (GAUZE/BANDAGES/DRESSINGS) ×2
DERMABOND ADVANCED .7 DNX12 (GAUZE/BANDAGES/DRESSINGS) ×2 IMPLANT
DRAIN HEMOVAC 1/8 X 5 (WOUND CARE) IMPLANT
DRAPE FEMORAL ANGIO 80X135IN (DRAPES) ×2 IMPLANT
DRAPE INCISE IOBAN 66X45 STRL (DRAPES) ×4 IMPLANT
ELECT REM PT RETURN 9FT ADLT (ELECTROSURGICAL) ×2
ELECTRODE REM PT RTRN 9FT ADLT (ELECTROSURGICAL) ×1 IMPLANT
EVACUATOR SILICONE 100CC (DRAIN) IMPLANT
GLOVE BIO SURGEON STRL SZ7.5 (GLOVE) ×2 IMPLANT
GOWN STRL REUS W/ TWL LRG LVL3 (GOWN DISPOSABLE) ×3 IMPLANT
GOWN STRL REUS W/TWL LRG LVL3 (GOWN DISPOSABLE) ×3
HEMOSTAT SPONGE AVITENE ULTRA (HEMOSTASIS) IMPLANT
INTRODUCER KIT GALT 7CM (INTRODUCER) ×1
KIT BASIN OR (CUSTOM PROCEDURE TRAY) ×2 IMPLANT
KIT ENCORE 26 ADVANTAGE (KITS) ×2 IMPLANT
KIT INTRODUCER GALT 7 (INTRODUCER) ×1 IMPLANT
KIT MICROPUNCTURE NIT STIFF (SHEATH) ×2 IMPLANT
KIT TURNOVER KIT B (KITS) ×2 IMPLANT
NEEDLE HYPO 25GX1X1/2 BEV (NEEDLE) IMPLANT
NEEDLE PERC 18GX7CM (NEEDLE) ×2 IMPLANT
NS IRRIG 1000ML POUR BTL (IV SOLUTION) ×4 IMPLANT
PACK CAROTID (CUSTOM PROCEDURE TRAY) ×2 IMPLANT
POSITIONER HEAD DONUT 9IN (MISCELLANEOUS) ×2 IMPLANT
PROTECTION STATION PRESSURIZED (MISCELLANEOUS) ×2
SET MICROPUNCTURE 5F STIFF (MISCELLANEOUS) ×2 IMPLANT
SHEATH AVANTI 11CM 5FR (SHEATH) IMPLANT
SHUNT CAROTID BYPASS 10 (VASCULAR PRODUCTS) IMPLANT
SHUNT CAROTID BYPASS 12FRX15.5 (VASCULAR PRODUCTS) IMPLANT
STATION PROTECTION PRESSURIZED (MISCELLANEOUS) ×1 IMPLANT
STENT TRANSCAROTID SYS 7X40 (Permanent Stent) ×2 IMPLANT
STOPCOCK MORSE 400PSI 3WAY (MISCELLANEOUS) ×2 IMPLANT
SUT ETHILON 3 0 PS 1 (SUTURE) IMPLANT
SUT PROLENE 6 0 CC (SUTURE) ×2 IMPLANT
SUT SILK 2 0 PERMA HAND 18 BK (SUTURE) ×4 IMPLANT
SUT SILK 2 0SH CR/8 30 (SUTURE) ×2 IMPLANT
SUT SILK 3 0 TIES 17X18 (SUTURE)
SUT SILK 3-0 18XBRD TIE BLK (SUTURE) IMPLANT
SUT VIC AB 3-0 SH 27 (SUTURE) ×1
SUT VIC AB 3-0 SH 27X BRD (SUTURE) ×1 IMPLANT
SUT VICRYL 4-0 PS2 18IN ABS (SUTURE) ×2 IMPLANT
SYR 10ML LL (SYRINGE) ×6 IMPLANT
SYR 20ML LL LF (SYRINGE) ×2 IMPLANT
SYR 5ML LL (SYRINGE) ×2 IMPLANT
SYR CONTROL 10ML LL (SYRINGE) IMPLANT
TOWEL GREEN STERILE (TOWEL DISPOSABLE) ×2 IMPLANT
TUBING EXTENTION W/L.L. (IV SETS) ×2 IMPLANT
WATER STERILE IRR 1000ML POUR (IV SOLUTION) ×2 IMPLANT
WIRE AMPLATZ SS-J .035X180CM (WIRE) IMPLANT
WIRE STARTER BENTSON 035X150 (WIRE) ×2 IMPLANT

## 2020-09-19 NOTE — Anesthesia Procedure Notes (Signed)
Arterial Line Insertion Start/End5/01/2021 8:50 AM, 09/19/2020 8:55 AM Performed by: Waynard Edwards, CRNA, CRNA  Patient location: Pre-op. Preanesthetic checklist: patient identified, IV checked, site marked, risks and benefits discussed, surgical consent, monitors and equipment checked, pre-op evaluation, timeout performed and anesthesia consent Lidocaine 1% used for infiltration Left, radial was placed Catheter size: 20 G Hand hygiene performed , maximum sterile barriers used  and Seldinger technique used Allen's test indicative of satisfactory collateral circulation Attempts: 1 Procedure performed without using ultrasound guided technique. Following insertion, dressing applied and Biopatch. Post procedure assessment: normal and unchanged  Patient tolerated the procedure well with no immediate complications.

## 2020-09-19 NOTE — Interval H&P Note (Signed)
History and Physical Interval Note:  09/19/2020 8:29 AM  Isabella [name]  has presented today for surgery, with the diagnosis of RIGHT INTERNAL CAROTID ARTERY STENOSIS.  The various methods of treatment have been discussed with the patient and family. After consideration of risks, benefits and other options for treatment, the patient has consented to  Procedure(s): RIGHT TRANSCAROTID ARTERY REVASCULARIZATION (Right) as a surgical intervention.  The patient's history has been reviewed, patient examined, no change in status, stable for surgery.  I have reviewed the patient's chart and labs.  Questions were answered to the patient's satisfaction.     Fabienne Bruns

## 2020-09-19 NOTE — Transfer of Care (Signed)
Immediate Anesthesia Transfer of Care Note  Patient: Isabella [name]  Procedure(s) Performed: RIGHT TRANSCAROTID ARTERY REVASCULARIZATION (Right Neck)  Patient Location: PACU  Anesthesia Type:General  Level of Consciousness: awake, oriented and patient cooperative  Airway & Oxygen Therapy: Patient Spontanous Breathing  Post-op Assessment: Report given to RN, Post -op Vital signs reviewed and stable and Patient moving all extremities X 4  Post vital signs: Reviewed and stable  Last Vitals:  Vitals Value Taken Time  BP 146/74 09/19/20 1223  Temp 36.2 C 09/19/20 1220  Pulse 81 09/19/20 1224  Resp 20 09/19/20 1224  SpO2 92 % 09/19/20 1224  Vitals shown include unvalidated device data.  Last Pain:  Vitals:   09/19/20 0600  TempSrc:   PainSc: 0-No pain      Patients Stated Pain Goal: 5 (09/19/20 0600)  Complications: No complications documented.

## 2020-09-19 NOTE — Op Note (Signed)
Procedure: Right TCAR carotid stent  Preoperative diagnosis: Recurrent stenosis right internal carotid artery  Postoperative diagnosis: Same  Anesthesia: General  Assistant: Lianne Cure, PA-C for manipulation of wires expediting procedure  Operative findings:   1.  Lesion length 30 mm  2.   7 x 40 in route stent  Operative details: After obtaining form consent, patient was taken to the operating room.  The patient was placed in supine position on the operating table.  After induction of general anesthesia and endotracheal ovation patient's entire right neck and chest and right groin were prepped and draped in usual sterile fashion.  Ultrasound was used to identify the patient's right common femoral vein.  Micropuncture needle was used to cannulate the right common femoral vein and the micropuncture wire advanced through this.  Micropuncture sheath was then placed and this and the micropuncture wire and dilator removed and replaced with an 035 Bentson wire.  This was advanced up to the inferior vena cava and the venous limb of the flow reversal sheath placed over the guidewire into the right femoral vein.  This was thoroughly flushed with heparinized saline.  Next attention was turned to the patient's right neck.  A longitudinal incision was made through pre-existing right neck scar.  No acute subcutaneous tissues down to the level of the platysma.  The sternocleidomastoid muscle was identified and reflected laterally.  Common carotid artery was dissected free at the base of the incision.  It was fairly scarred in due to multiple previous operations in the neck.  The artery was fairly soft on palpation.  It was dissected free circumferentially and a vessel loop placed around it.  The patient was then given 6000 units of intravenous heparin.  A 5 oh pursestring stitch was placed at the base of the common carotid artery.  A micropuncture needle was then advanced into the common carotid artery and  the micropuncture wire advanced and the micropuncture sheath placed over this.  I initially used a stiff dilator due to very thickened wall of the artery.  After placing the micropuncture sheath in the artery a contrast angiogram was obtained in an AP and a 15 degree RAO projection.  This showed greater than 90% stenosis of the distal internal carotid artery.  Bifurcation was marked and the carotid Amplatz wire advanced through the micropuncture sheath up to the level of the carotid bifurcation.  Micropuncture sheath was then removed and replaced with the [place] arterial limb of the sheath.  Prior to placing the sheath a dilator was placed over the guidewire to dilate the tract.  The sheath was pushed into position and sutured to the skin.  2 views of contrast angiogram were then performed to make sure there was no dissection in the common carotid artery.  Common carotid artery was controlled with a vessel loop and we confirmed flow reversal.  Then proceeded to advance the 014 wire across the lesion with minimal difficulty.  A 4 x 30 balloon was then advanced over the wire and inflated to nominal pressure for approximately 45 seconds.  2 inflations were performed overlapping.  A 7 x 40 on route stent was then placed over the guidewire and centered on the lesion and deployed.  Completion angiogram showed good wall apposition of the stent no evidence of dissection.  There was still was a small waist in the central section and some swirling of contrast there.  Therefore we postdilated with a 5 x 20 balloon.  This was then removed  and a final completion angiogram in 2 views showed no evidence of dissection the stent was fully deployed with no areas of narrowing.  The guidewire was then removed.  The flow reversal was discontinued.  Sheath was removed and the pursestring secured on the common carotid artery to achieve hemostasis.  Neck incision was closed with a running 3-0 Vicryl suture in the platysma and 4-0 Vicryl  subcuticular stitch in the skin and Dermabond was applied.  The venous sheath was removed and hemostasis obtained with direct pressure.  The patient tired procedure well and there were no complications.  The incident sponge needle count was correct end of the case.  Patient was taken the recovery room in stable condition.  She was moving upper extremity and lower extremity symmetrically at conclusion of the case.  Fabienne Bruns, MD [facility] Office: [phone]

## 2020-09-19 NOTE — Anesthesia Procedure Notes (Signed)
Procedure Name: Intubation Date/Time: 09/19/2020 10:19 AM Performed by: Lowella Dell, CRNA Pre-anesthesia Checklist: Patient identified, Emergency Drugs available, Suction available and Patient being monitored Patient Re-evaluated:Patient Re-evaluated prior to induction Oxygen Delivery Method: Circle System Utilized Preoxygenation: Pre-oxygenation with 100% oxygen Induction Type: IV induction Ventilation: Mask ventilation without difficulty and Oral airway inserted - appropriate to patient size Laryngoscope Size: Mac and 3 Grade View: Grade I Tube type: Oral Tube size: 7.0 mm Number of attempts: 1 Airway Equipment and Method: Stylet Placement Confirmation: ETT inserted through vocal cords under direct vision,  positive ETCO2 and breath sounds checked- equal and bilateral Secured at: 20 cm Tube secured with: Tape Dental Injury: Teeth and Oropharynx as per pre-operative assessment

## 2020-09-20 ENCOUNTER — Encounter (HOSPITAL_COMMUNITY): Payer: Self-pay | Admitting: Vascular Surgery

## 2020-09-20 ENCOUNTER — Other Ambulatory Visit: Payer: Self-pay

## 2020-09-20 DIAGNOSIS — I6523 Occlusion and stenosis of bilateral carotid arteries: Secondary | ICD-10-CM

## 2020-09-20 LAB — BASIC METABOLIC PANEL
Anion gap: 6 (ref 5–15)
BUN: 18 mg/dL (ref 6–20)
CO2: 26 mmol/L (ref 22–32)
Calcium: 8.7 mg/dL — ABNORMAL LOW (ref 8.9–10.3)
Chloride: 103 mmol/L (ref 98–111)
Creatinine, Ser: 0.85 mg/dL (ref 0.44–1.00)
GFR, Estimated: >60 mL/min (ref >60)
Glucose, Bld: 166 mg/dL — ABNORMAL HIGH (ref 70–99)
Potassium: 4.4 mmol/L (ref 3.5–5.1)
Sodium: 135 mmol/L (ref 135–145)

## 2020-09-20 LAB — GLUCOSE, CAPILLARY
Glucose-Capillary: 146 mg/dL — ABNORMAL HIGH (ref 70–99)
Glucose-Capillary: 169 mg/dL — ABNORMAL HIGH (ref 70–99)
Glucose-Capillary: 176 mg/dL — ABNORMAL HIGH (ref 70–99)
Glucose-Capillary: 316 mg/dL — ABNORMAL HIGH (ref 70–99)

## 2020-09-20 LAB — LIPID PANEL
Cholesterol: 210 mg/dL — ABNORMAL HIGH (ref 0–200)
HDL: 37 mg/dL — ABNORMAL LOW (ref >40)
LDL Cholesterol: 147 mg/dL — ABNORMAL HIGH (ref 0–99)
Total CHOL/HDL Ratio: 5.7 RATIO
Triglycerides: 130 mg/dL (ref <150)
VLDL: 26 mg/dL (ref 0–40)

## 2020-09-20 LAB — CBC
HCT: 39 % (ref 36.0–46.0)
Hemoglobin: 12.9 g/dL (ref 12.0–15.0)
MCH: 30.6 pg (ref 26.0–34.0)
MCHC: 33.1 g/dL (ref 30.0–36.0)
MCV: 92.4 fL (ref 80.0–100.0)
Platelets: 286 10*3/uL (ref 150–400)
RBC: 4.22 MIL/uL (ref 3.87–5.11)
RDW: 13.4 % (ref 11.5–15.5)
WBC: 17.6 10*3/uL — ABNORMAL HIGH (ref 4.0–10.5)
nRBC: 0 % (ref 0.0–0.2)

## 2020-09-20 MED ORDER — EZETIMIBE 10 MG PO TABS
10.0000 mg | ORAL_TABLET | Freq: Every day | ORAL | Status: DC
  Administered 2020-09-20 – 2020-09-21 (×2): 10 mg via ORAL
  Filled 2020-09-20 (×2): qty 1

## 2020-09-20 MED ORDER — ROSUVASTATIN CALCIUM 5 MG PO TABS: 10.0000 mg | ORAL_TABLET | Freq: Every day | ORAL | Status: DC

## 2020-09-20 NOTE — Progress Notes (Signed)
SATURATION QUALIFICATIONS: (This note is used to comply with regulatory documentation for home oxygen)  Patient Saturations on Room Air at Rest = 95%  Patient Saturations on Room Air while Ambulating = 88%  Patient Saturations on 1 Liters of oxygen while Ambulating = 94%  Please briefly explain why patient needs home oxygen: Pt not able to maintain Sp02 >90% without supplemental 02.     Army Melia, DPT Acute Rehabilitation Services Pager [phone] Office [phone]

## 2020-09-20 NOTE — Progress Notes (Addendum)
  Progress Note    09/20/2020 7:51 AM 1 Day Post-Op  Subjective:  Overall doing well. Says she feels best if she stays another day. Says her hoarseness was from prior to surgery. Swallowing without difficulty. Tolerating diet   Vitals:   09/20/20 0211 09/20/20 0727  BP: (!) 122/97 122/62  Pulse: 69 62  Resp: 20 19  Temp: 98 F (36.7 C) 97.9 F (36.6 C)  SpO2: 92% 98%   Physical Exam: Cardiac: regular Lungs: non labored Incisions: right neck incision is clean, dry and intact without swelling or hematoma Extremities:  Right femoral access site clean, dry and intact without swelling or hematoma. Extremities well perfused and warm. Moving without any deficits Abdomen: soft, non tender Neurologic: alert and oriented. Smile symmetric. Tongue midline. Sensation equal and symmetric. Speech Coherent. Some hoarseness  CBC    Component Value Date/Time   WBC 17.6 (H) 09/20/2020 0245   RBC 4.22 09/20/2020 0245   HGB 12.9 09/20/2020 0245   HCT 39.0 09/20/2020 0245   PLT 286 09/20/2020 0245   MCV 92.4 09/20/2020 0245   MCH 30.6 09/20/2020 0245   MCHC 33.1 09/20/2020 0245   RDW 13.4 09/20/2020 0245    BMET    Component Value Date/Time   NA 135 09/20/2020 0245   K 4.4 09/20/2020 0245   CL 103 09/20/2020 0245   CO2 26 09/20/2020 0245   GLUCOSE 166 (H) 09/20/2020 0245   BUN 18 09/20/2020 0245   CREATININE 0.85 09/20/2020 0245   CALCIUM 8.7 (L) 09/20/2020 0245   GFRNONAA >60 09/20/2020 0245   GFRAA >60 12/19/2017 0500    INR    Component Value Date/Time   INR 1.0 09/15/2020 1559     Intake/Output Summary (Last 24 hours) at 09/20/2020 0751 Last data filed at 09/19/2020 1218 Gross per 24 hour  Intake 1600 ml  Output 30 ml  Net 1570 ml     Assessment/Plan:  55 y.o. female is s/p Right TCAR 1 Day Post-Op. Doing well post op. Neurologically intact. Right neck incision is clean, dry and intact without swelling or hematoma. Right femoral access site without swelling or  hematoma. VSS. Hemodynamically stable. She is to continue her Aspirin, Statin and Plavix. OOB/ mobilize today as tolerated  DVT prophylaxis:  Sq heparin   Graceann Congress, PA-C [facility] [phone] 09/20/2020 7:51 AM   Agree with above.  Patient swallowing okay.  Neuro at baseline 5/5 upper extremity lower extremity motor strength.  Neck incision no hematoma no groin hematoma  Patient needs lifelong Plavix and aspirin.  She has muscle aches with statin so we will not start this.  Plan to discharge home later today.  Patient does have some upper chest congestion which is most likely related to mobilization in her lungs after stopping cigarette smoking for a couple of days.  I encouraged her to use an incentive spirometer but she stated that she did not need this.  Discharge home today if overall stable this afternoon.  Fabienne Bruns, MD [facility] Office: [phone]

## 2020-09-20 NOTE — Discharge Instructions (Signed)
   [facility]  Discharge Instructions   Carotid Surgery  Please refer to the following instructions for your post-procedure care. Your surgeon or physician assistant will discuss any changes with you.  Activity  You are encouraged to walk as much as you can. You can slowly return to normal activities but must avoid strenuous activity and heavy lifting until your doctor tell you it's okay. Avoid activities such as vacuuming or swinging a golf club. You can drive after one week if you are comfortable and you are no longer taking prescription pain medications. It is normal to feel tired for serval weeks after your surgery. It is also normal to have difficulty with sleep habits, eating, and bowel movements after surgery. These will go away with time.  Bathing/Showering  Shower daily after you go home. Do not soak in a bathtub, hot tub, or swim until the incision heals completely.  Incision Care  Shower every day. Clean your incision with mild soap and water. Pat the area dry with a clean towel. You do not need a bandage unless otherwise instructed. Do not apply any ointments or creams to your incision. You may have skin glue on your incision. Do not peel it off. It will come off on its own in about one week. Your incision may feel thickened and raised for several weeks after your surgery. This is normal and the skin will soften over time.   For Men Only: It's okay to shave around the incision but do not shave the incision itself for 2 weeks. It is common to have numbness under your chin that could last for several months.  Diet  Resume your normal diet. There are no special food restrictions following this procedure. A low fat/low cholesterol diet is recommended for all patients with vascular disease. In order to heal from your surgery, it is CRITICAL to get adequate nutrition. Your body requires vitamins, minerals, and protein. Vegetables are the best source of  vitamins and minerals. Vegetables also provide the perfect balance of protein. Processed food has little nutritional value, so try to avoid this.  Medications  Resume taking all of your medications unless your doctor or physician assistant tells you not to. If your incision is causing pain, you may take over-the- counter pain relievers such as acetaminophen (Tylenol). If you were prescribed a stronger pain medication, please be aware these medications can cause nausea and constipation. Prevent nausea by taking the medication with a snack or meal. Avoid constipation by drinking plenty of fluids and eating foods with a high amount of fiber, such as fruits, vegetables, and grains.   Do not take Tylenol if you are taking prescription pain medications.  Follow Up  Our office will schedule a follow up appointment 2-3 weeks following discharge.  Please call us immediately for any of the following conditions  . Increased pain, redness, drainage (pus) from your incision site. . Fever of 101 degrees or higher. . If you should develop stroke (slurred speech, difficulty swallowing, weakness on one side of your body, loss of vision) you should call 911 and go to the nearest emergency room. .  Reduce your risk of vascular disease:  . Stop smoking. If you would like help call [facility] at 1-800-QUIT-NOW (1-800-784-8669) or [place] at 336-586-4000. . Manage your cholesterol . Maintain a desired weight . Control your diabetes . Keep your blood pressure down .  If you have any questions, please call the office at [phone]. 

## 2020-09-20 NOTE — Progress Notes (Signed)
Mobility Specialist - Progress Note   09/20/20 1733  Mobility  Activity Ambulated in hall  Level of Assistance Independent  Assistive Device None  Distance Ambulated (ft) 470 ft  Mobility Ambulated independently in hallway  Mobility Response Tolerated well  Mobility performed by Mobility specialist  $Mobility charge 1 Mobility   Pre-mobility, RA: 78 HR, 94% SpO2 During mobility,1L O2: 120 HR, 92% SpO2 Post-mobility, RA: 75 HR, 91% SpO2  Pt asx throughout ambulation. Pt sitting up on edge of bed after walk.   Mamie Levers Mobility Specialist Mobility Specialist Phone: [phone]

## 2020-09-20 NOTE — Anesthesia Postprocedure Evaluation (Signed)
Anesthesia Post Note  Patient: Isabella [name]  Procedure(s) Performed: RIGHT TRANSCAROTID ARTERY REVASCULARIZATION (Right Neck)     Patient location during evaluation: PACU Anesthesia Type: General Level of consciousness: sedated and patient cooperative Pain management: pain level controlled Vital Signs Assessment: post-procedure vital signs reviewed and stable Respiratory status: spontaneous breathing Cardiovascular status: stable Anesthetic complications: no   No complications documented.  Last Vitals:  Vitals:   09/20/20 0211 09/20/20 0727  BP: (!) 122/97 122/62  Pulse: 69 62  Resp: 20 19  Temp: 36.7 C 36.6 C  SpO2: 92% 98%    Last Pain:  Vitals:   09/20/20 0727  TempSrc: Oral  PainSc:                  [name]

## 2020-09-20 NOTE — Evaluation (Signed)
Physical Therapy Evaluation Patient Details Name: Isabella [name] MRN: [id number] DOB: 08-31-1965 Today's Date: 09/20/2020   History of Present Illness  Patient is a 55 y/o female s/p right transcarotid artery revascularization 09/19/20. PMH includes DM, CVA, PVD, CAD, COPD.  Clinical Impression  Patient presents with mild dizziness and mild balance deficits impacting mobility s/p above. Pt lives at home with family and reports being independent for ADLs and walking PTA. Reports no pain today and dizziness has improved immensely s/p surgery. Tolerated bed mobility, transfers and gait training Mod I without difficulty. No evidence of imbalance even with head turns. Sp02 dropped to 88% on RA during activity, requiring 1L/min 02 [place] to maintain Sp02 >90%; will need supplemental 02 for d/c. Likely does not need 02 at rest. Pt does not require skilled therapy services as pt functioning close to or better than baseline prior to surgery. All education completed. Discharge from therapy.    Follow Up Recommendations No PT follow up    Equipment Recommendations  None recommended by PT    Recommendations for Other Services       Precautions / Restrictions Precautions Precautions: Fall;Other (comment) Precaution Comments: watch 02 Restrictions Weight Bearing Restrictions: No      Mobility  Bed Mobility Overal bed mobility: Modified Independent             General bed mobility comments: No assist needed, no use of rails.    Transfers Overall transfer level: Modified independent Equipment used: None             General transfer comment: Slightly dizzy initially but this improved, stood from EOB x1.  Ambulation/Gait Ambulation/Gait assistance: Modified independent (Device/Increase time) Gait Distance (Feet): 500 Feet Assistive device: None Gait Pattern/deviations: Step-through pattern;Decreased stride length   Gait velocity interpretation: 1.31 - 2.62 ft/sec, indicative of  limited community ambulator General Gait Details: Steady gait with no use of DME, reports dizziness is minimal to none, no evidence of imbalance. Sp02 dropped to 88% on RA, able to maintain >90% on 1L/min 02 [place].  Stairs            Wheelchair Mobility    Modified Rankin (Stroke Patients Only)       Balance Overall balance assessment: Mild deficits observed, not formally tested                                           Pertinent Vitals/Pain Pain Assessment: No/denies pain    Home Living Family/patient expects to be discharged to:: Private residence Living Arrangements: Spouse/significant other;Other relatives (59 y/o grandson) Available Help at Discharge: Family;Available PRN/intermittently Type of Home: House Home Access: Stairs to enter   Entrance Stairs-Number of Steps: 1+ 1 Home Layout: Two level;Able to live on main level with bedroom/bathroom Home Equipment: Shower seat - built in;Walker - 2 wheels;Cane - single point;Crutches      Prior Function Level of Independence: Independent         Comments: Drives. Independent for ADLs/IADLs. Has been dizzy     Hand Dominance   Dominant Hand: Right    Extremity/Trunk Assessment   Upper Extremity Assessment Upper Extremity Assessment: Defer to OT evaluation    Lower Extremity Assessment Lower Extremity Assessment: Overall WFL for tasks assessed    Cervical / Trunk Assessment Cervical / Trunk Assessment: Normal  Communication   Communication: No difficulties  Cognition Arousal/Alertness: Awake/alert Behavior  During Therapy: WFL for tasks assessed/performed Overall Cognitive Status: Within Functional Limits for tasks assessed                                 General Comments: Reports being stubborn      General Comments General comments (skin integrity, edema, etc.): Daughter present during session.    Exercises     Assessment/Plan    PT Assessment Patent does not  need any further PT services  PT Problem List         PT Treatment Interventions      PT Goals (Current goals can be found in the Care Plan section)  Acute Rehab PT Goals Patient Stated Goal: to get 02 for home PT Goal Formulation: All assessment and education complete, DC therapy    Frequency     Barriers to discharge        Co-evaluation               AM-PAC PT "6 Clicks" Mobility  Outcome Measure Help needed turning from your back to your side while in a flat bed without using bedrails?: None Help needed moving from lying on your back to sitting on the side of a flat bed without using bedrails?: None Help needed moving to and from a bed to a chair (including a wheelchair)?: None Help needed standing up from a chair using your arms (e.g., wheelchair or bedside chair)?: None Help needed to walk in hospital room?: None Help needed climbing 3-5 steps with a railing? : A Little 6 Click Score: 23    End of Session Equipment Utilized During Treatment: Oxygen Activity Tolerance: Patient tolerated treatment well Patient left: in bed;with call bell/phone within reach Nurse Communication: Mobility status PT Visit Diagnosis: Difficulty in walking, not elsewhere classified (R26.2)    Time: 9741-6384 PT Time Calculation (min) (ACUTE ONLY): 24 min   Charges:   PT Evaluation $PT Eval Moderate Complexity: 1 Mod PT Treatments $Gait Training: 8-22 mins        Vale Haven, PT, DPT Acute Rehabilitation Services Pager [phone] Office [phone]      Blake Divine A Lanier Ensign 09/20/2020, 3:24 PM

## 2020-09-20 NOTE — Progress Notes (Signed)
PHARMACIST LIPID MONITORING   Isabella [name] is a 55 y.o. female admitted on 09/19/2020 with recurrent stenosis right internal carotid artery.  Pharmacy has been consulted to optimize lipid-lowering therapy with the indication of secondary prevention for clinical ASCVD.  Recent Labs:  Lipid Panel (last 6 months):   Lab Results  Component Value Date   CHOL 210 (H) 09/20/2020   TRIG 130 09/20/2020   HDL 37 (L) 09/20/2020   CHOLHDL 5.7 09/20/2020   VLDL 26 09/20/2020   LDLCALC 147 (H) 09/20/2020    Hepatic function panel (last 6 months):   Lab Results  Component Value Date   AST 17 09/15/2020   ALT 18 09/15/2020   ALKPHOS 77 09/15/2020   BILITOT 0.5 09/15/2020    SCr (since admission):   Serum creatinine: 0.85 mg/dL 33/35/45 6256 Estimated creatinine clearance: 62.3 mL/min  Current therapy and lipid therapy tolerance Current lipid-lowering therapy: Rosuvastatin 10 mg daily  (only on for 3 days prior to admission) Previous lipid-lowering therapies (if applicable): unknown specific names. Documented or reported allergies or intolerances to lipid-lowering therapies (if applicable): to STATINS causes muscle weakness, arm pain and twitching  Assessment:   55 y/o female s/p right transcarotid artery revascularization 09/19/20.  PMH of DM, CVA, PVD, CAD, COPD.  On 09/20/20 the  LDL = 147 on Rosuvastin 10 mg daily (only taking for last 3 days PTA).    Plan:   1.Statin intensity (high intensity recommended for all patients regardless of the LDL) Currently takes Rosuvastatin 10 mg daily :  Statin intolerance noted. Discussed with patient, increase statin therapy to 20 mg daily, however patient refuses and said will not continue .  2.Add ezetimibe (if any one of the following):   Cannot tolerate statin at any dose.  Currently already taking Ezetimibe 10 mg daily which patient reports she recently started.   3.Refer to lipid clinic:   Yes  4.Follow-up with:  Lipid clinic  referral.   Thank you for allowing pharmacy to be part of this patients care team.  Noah Delaine, RPh Clinical Pharmacist [phone] Please check AMION for all [facility] Pharmacy phone numbers After 10:00 PM, call Main Pharmacy [phone] 09/20/2020 4:59 PM  09/20/2020, 4:52 PM

## 2020-09-21 LAB — GLUCOSE, CAPILLARY
Glucose-Capillary: 70 mg/dL (ref 70–99)
Glucose-Capillary: 86 mg/dL (ref 70–99)

## 2020-09-21 MED ORDER — OXYCODONE-ACETAMINOPHEN 5-325 MG PO TABS: 1.0000 | ORAL_TABLET | Freq: Four times a day (QID) | ORAL | 0 refills | Status: DC | PRN

## 2020-09-21 NOTE — Progress Notes (Addendum)
  Progress Note    09/21/2020 8:31 AM 2 Days Post-Op  Subjective:  Overall doing well. Ready to go home   Vitals:   09/21/20 0431 09/21/20 0728  BP: (!) 122/58 (!) 129/47  Pulse: 68 73  Resp: 14 13  Temp:  97.7 F (36.5 C)  SpO2: 94% 98%   Physical Exam: Cardiac: regular Lungs: non labored Incisions: right neck incision is clean, dry and intact without swelling or hematoma Extremities:  Moving all extremities without deficits Neurologic: alert and oriented. CN intact  CBC    Component Value Date/Time   WBC 17.6 (H) 09/20/2020 0245   RBC 4.22 09/20/2020 0245   HGB 12.9 09/20/2020 0245   HCT 39.0 09/20/2020 0245   PLT 286 09/20/2020 0245   MCV 92.4 09/20/2020 0245   MCH 30.6 09/20/2020 0245   MCHC 33.1 09/20/2020 0245   RDW 13.4 09/20/2020 0245    BMET    Component Value Date/Time   NA 135 09/20/2020 0245   K 4.4 09/20/2020 0245   CL 103 09/20/2020 0245   CO2 26 09/20/2020 0245   GLUCOSE 166 (H) 09/20/2020 0245   BUN 18 09/20/2020 0245   CREATININE 0.85 09/20/2020 0245   CALCIUM 8.7 (L) 09/20/2020 0245   GFRNONAA >60 09/20/2020 0245   GFRAA >60 12/19/2017 0500    INR    Component Value Date/Time   INR 1.0 09/15/2020 1559    No intake or output data in the 24 hours ending 09/21/20 0831   Assessment/Plan:  55 y.o. female is s/p Right TCAR 2 Days Post-Op. Neurologically intact. Incision right neck intact and without swelling or hematoma. VSS. PT evaluated and desaturates on ambulation <90% unless on 2L. Order placed for supplemental home Oxygen. Encourage continued IS. She otherwise is stable for discharge home today. Says her ride is not available until this afternoon. She will go home on Aspirin, Plavix and Zetia.  She will have follow up in 4 weeks with carotid duplex  Graceann Congress, PA-C [facility] [phone] 09/21/2020 8:31 AM   Agree with above.   Needs to work on pulmonary toilet.  She overall feels better today  D/c  home  Fabienne Bruns, MD [facility] Office: [phone]

## 2020-09-21 NOTE — Discharge Summary (Signed)
Discharge Summary     Isabella [name] 06-11-1965 55 y.o. female  [id number]  Admission Date: 09/19/2020  Discharge Date: 09/21/2020  Physician: Sherren Kerns, MD  Admission Diagnosis: Carotid stenosis, asymptomatic [I65.29]   HPI:   This is a 55 y.o. female who has had previous right common carotid stent.  This is well below the clavicle.  This was placed in 2019.  Her left carotid circulation is patent.  She has no significant intracranial stenosis.  This is based on a CT angiogram from September 2021.  She has also had a previous innominate bypass in [place] many years ago.  She has also had a previous right carotid endarterectomy in [place] many years ago and a left carotid endarterectomy mobile partner Dr. Edilia Bo several years ago.  She is on Plavix aspirin and a statin was represcribed today.  It was emphasized to the patient that she will need to be on the statin for at least 6 weeks perirocedure.  She states that she is taking her aspirin and Plavix.  Her most recent carotid duplex scan in February 2022 showed velocities on the right side of 661/269 with a patent right common carotid stent left side was 40 to 60%.  Vertebrals were antegrade. She is indicated for right transcarotid revascularization.   Hospital Course:  The patient was admitted to the hospital and taken to the operating room on 09/19/2020 and underwent right transcarotid revascularization  Findings: 30 mm lesion  The pt tolerated the procedure well and was transported to the PACU in good condition.   By POD 1, the pt neuro status remained intact. Right neck incision intact and without swelling or hematoma. Right common femoral access site without swelling or hematoma. Tolerating diet. Vitals stable. Hemodynamically stable. Tolerated working with mobility specialist. PT evaluated her for home oxygen needs with recommendation for supplemental home oxygen with desaturation <90% on exertion. Patient otherwise  stable for discharge home but felt she needed another night to recover  POD#2 uneventful night. Remain neurologically intact. Right neck incision remained intact without swelling or hematoma. Vitals remained stable. She is stable for discharge home today. She will remain on Aspirin, Plavix and Zetia. She is intolerable to statins. PDMP was reviewed and post operative pain medication was sent to her pharmacy. She will have follow up in 1 month with Carotid Duplex   Recent Labs    09/20/20 0245  NA 135  K 4.4  CL 103  CO2 26  GLUCOSE 166*  BUN 18  CALCIUM 8.7*   Recent Labs    09/19/20 1530 09/20/20 0245  WBC 13.0* 17.6*  HGB 13.2 12.9  HCT 39.8 39.0  PLT 256 286   No results for input(s): INR in the last 72 hours.   Discharge Instructions    Call MD for:  difficulty breathing, headache or visual disturbances   Complete by: As directed    Call MD for:  persistant dizziness or light-headedness   Complete by: As directed    Call MD for:  redness, tenderness, or signs of infection (pain, swelling, redness, odor or green/yellow discharge around incision site)   Complete by: As directed    Call MD for:  severe uncontrolled pain   Complete by: As directed    Call MD for:  temperature >100.4   Complete by: As directed    Diet - low sodium heart healthy   Complete by: As directed    Discharge patient   Complete by: As directed  Discharge disposition: 01-Home or Self Care   Discharge patient date: 09/20/2020   Discharge wound care:   Complete by: As directed    Clean incision with mild soap and water, pat dry. Do not apply any topical lotions or ointments   Driving Restrictions   Complete by: As directed    No driving for 2 weeks or while taking pain medication   Increase activity slowly   Complete by: As directed    Lifting restrictions   Complete by: As directed    No heavy lifting, pushing, pulling > 10 lbs for 2 weeks      Discharge Diagnosis:  Carotid stenosis,  asymptomatic [I65.29]  Secondary Diagnosis: Patient Active Problem List   Diagnosis Date Noted  . Carotid stenosis, asymptomatic 09/19/2020  . Laceration of left middle finger without foreign body without damage to nail 04/16/2018  . Open displaced fracture of distal phalanx of left middle finger 04/16/2018  . Asymptomatic carotid artery stenosis without infarction, right 12/18/2017  . GERD without esophagitis 10/17/2017  . Statin declined 10/17/2017  . Noncompliance 07/05/2017  . Carotid stenosis 06/25/2017  . Mixed hyperlipidemia 12/31/2016  . Coronary arteriosclerosis 12/31/2016  . Essential hypertension 12/31/2016  . History of CVA in adulthood 12/27/2016  . Tobacco abuse 12/27/2016  . Thumb pain, right 02/22/2016  . Type 2 diabetes mellitus with complication, without long-term current use of insulin (HCC) 02/22/2016  . Bilateral carotid artery stenosis 02/22/2016  . CKD (chronic kidney disease) stage 2, GFR 60-89 ml/min 07/21/2015   Past Medical History:  Diagnosis Date  . Anemia   . Arthritis    "lower back, knees, elbows"  . Carotid artery occlusion   . Complication of anesthesia 2013   Patient stated that she could not  "wake up from anesthesia and coded" during aorta surgery in [facility]; by account likely post-op aspiration with respiratory distress/reintubation 11/2011  . COPD (chronic obstructive pulmonary disease) (HCC)    patient states she has been diagnosed with COPD "but does not use any inhalers"  . Coronary artery disease   . Diabetes mellitus without complication (HCC)    Type II  . Fatty liver   . GERD (gastroesophageal reflux disease)    diet controlled  . History of hiatal hernia   . History of kidney stones   . HPV in female   . Peripheral vascular disease (HCC)   . Pneumonia   . Poison sumac    acquired it approx on Friday.  On back, below left shoulder and under breasts  . Stroke [facility]) 2014   , expressive aphisa at times, Left hand cannot  close    Allergies as of 09/21/2020      Reactions   Statins Other (See Comments)   Muscle weakness Arm Pain Twitching       Medication List    STOP taking these medications   rosuvastatin 10 MG tablet Commonly known as: CRESTOR     TAKE these medications   aspirin EC 81 MG tablet Take 81 mg by mouth daily. Swallow whole.   BD Pen Needle Nano 2nd Gen 32G X 4 MM Misc Generic drug: Insulin Pen Needle   calcium-vitamin D 500-200 MG-UNIT tablet Commonly known as: OSCAL WITH D Take 1 tablet by mouth 3 (three) times a week.   clopidogrel 75 MG tablet Commonly known as: PLAVIX TAKE 1 TABLET BY MOUTH EVERY DAY   ezetimibe 10 MG tablet Commonly known as: ZETIA Take 10 mg by mouth daily.  insulin glargine 100 UNIT/ML Solostar Pen Commonly known as: LANTUS Inject 32 Units into the skin at bedtime.   oxyCODONE-acetaminophen 5-325 MG tablet Commonly known as: Percocet Take 1 tablet by mouth every 6 (six) hours as needed for severe pain.   Precision QID Test test strip Generic drug: glucose blood 1 strip by Other route every day            Durable Medical Equipment  (From admission, onward)         Start     Ordered   09/21/20 0803  For home use only DME oxygen  Once       Question Answer Comment  Length of Need Lifetime   Mode or (Route) Nasal cannula   Liters per Minute 2   Frequency Continuous (stationary and portable oxygen unit needed)   Oxygen delivery system Gas      09/21/20 0802           Discharge Care Instructions  (From admission, onward)         Start     Ordered   09/21/20 0000  Discharge wound care:       Comments: Clean incision with mild soap and water, pat dry. Do not apply any topical lotions or ointments   09/21/20 0841           [facility] Discharge Instructions Carotid Endarterectomy (CEA)  Please refer to the following instructions for your post-procedure care. Your surgeon or  physician assistant will discuss any changes with you.  Activity  You are encouraged to walk as much as you can. You can slowly return to normal activities but must avoid strenuous activity and heavy lifting until your doctor tell you it's OK. Avoid activities such as vacuuming or swinging a golf club. You can drive after one week if you are comfortable and you are no longer taking prescription pain medications. It is normal to feel tired for serval weeks after your surgery. It is also normal to have difficulty with sleep habits, eating, and bowel movements after surgery. These will go away with time.  Bathing/Showering  You may shower after you come home. Do not soak in a bathtub, hot tub, or swim until the incision heals completely.  Incision Care  Shower every day. Clean your incision with mild soap and water. Pat the area dry with a clean towel. You do not need a bandage unless otherwise instructed. Do not apply any ointments or creams to your incision. You may have skin glue on your incision. Do not peel it off. It will come off on its own in about one week. Your incision may feel thickened and raised for several weeks after your surgery. This is normal and the skin will soften over time. For Men Only: It's OK to shave around the incision but do not shave the incision itself for 2 weeks. It is common to have numbness under your chin that could last for several months.  Diet  Resume your normal diet. There are no special food restrictions following this procedure. A low fat/low cholesterol diet is recommended for all patients with vascular disease. In order to heal from your surgery, it is CRITICAL to get adequate nutrition. Your body requires vitamins, minerals, and protein. Vegetables are the best source of vitamins and minerals. Vegetables also provide the perfect balance of protein. Processed food has little nutritional value, so try to avoid this.  Medications  Resume taking all of your  medications unless your doctor or physician assistant tells you not to.  If your incision is causing pain, you may take over-the- counter pain relievers such as acetaminophen (Tylenol). If you were prescribed a stronger pain medication, please be aware these medications can cause nausea and constipation.  Prevent nausea by taking the medication with a snack or meal. Avoid constipation by drinking plenty of fluids and eating foods with a high amount of fiber, such as fruits, vegetables, and grains.   Do not take Tylenol if you are taking prescription pain medications.  Follow Up  Our office will schedule a follow up appointment 2-3 weeks following discharge.  Please call us immediately for any of the following conditions  . Increased pain, redness, drainage (pus) from your incision site. . Fever of 101 degrees or higher. . If you should develop stroke (slurred speech, difficulty swallowing, weakness on one side of your body, loss of vision) you should call 911 and go to the nearest emergency room. .  Reduce your risk of vascular disease:  . Stop smoking. If you would like help call [facility] at 1-800-QUIT-NOW (872-299-76231-9412870352) or [place] at [phone]. . Manage your cholesterol . Maintain a desired weight . Control your diabetes . Keep your blood pressure down .  If you have any questions, please call the office at [phone].  Prescriptions given: 1.   Percocet 5/325 mg #15 No Refill  Disposition: home  Patient's condition: is Good  Follow up: 1. [facility] in 4 weeks with duplex   Doreatha MassedSamantha Rhyne, PA-C [facility] [phone]   --- For [facility] use ---   Modified Rankin score at D/C (0-6): 0  IV medication needed for:  1. Hypertension: No 2. Hypotension: No  Post-op Complications: No  1. Post-op CVA or TIA: No  If yes: Event classification (right eye, left eye, right cortical, left cortical, verterobasilar, other): none  If yes: Timing of  event (intra-op, <6 hrs post-op, >=6 hrs post-op, unknown): none  2. CN injury: No  If yes: CN not injuried   3. Myocardial infarction: No  If yes: Dx by (EKG or clinical, Troponin): n/a  4.  CHF: No  5.  Dysrhythmia (new): No  6. Wound infection: No  7. Reperfusion symptoms: No  8. Return to OR: No  If yes: return to OR for (bleeding, neurologic, other CEA incision, other): n/a  Discharge medications: Statin use:  No-allergy ASA use:  Yes   Beta blocker use:  No ACE-Inhibitor use:  No  ARB use:  No CCB use: No P2Y12 Antagonist use: Yes, Arly.Keller[X ] Plavix, [ ]  Plasugrel, [ ]  Ticlopinine, [ ]  Ticagrelor, [ ]  Other, [ ]  No for medical reason, [ ]  Non-compliant, [ ]  Not-indicated Anti-coagulant use:  No, [ ]  Warfarin, [ ]  Rivaroxaban, [ ]  Dabigatran,

## 2020-09-21 NOTE — TOC Transition Note (Signed)
Transition of Care (TOC) - CM/SW Discharge Note Donn Pierini RN, BSN Transitions of Care Unit 4E- RN Case Manager See Treatment Team for direct phone #    Patient Details  Name: Isabella [name] MRN: [id number] Date of Birth: 1965-06-26  [facility] CM/SW Contact:  Darrold Span, RN Phone Number: 09/21/2020, 11:55 AM   Clinical Narrative:    Pt stable for transition home today, family to transport home later this afternoon.  Noted order for home 02- per bedside RN pt has been weaned off of 02 this AM- on re-check for qualifying documentation pt does not meet guidelines for Medicare coverage of home 02. No home 02 arranged for discharge   Final next level of care: Home/Self Care Barriers to Discharge: No Barriers Identified   Patient Goals and CMS Choice     Choice offered to / list presented to : NA  Discharge Placement               home        Discharge Plan and Services   Discharge Planning Services: CM Consult Post Acute Care Choice: NA          DME Arranged: Oxygen (Pt weaned off 02 this am- on re-check does not meet criteria for home 02) DME Agency: NA         HH Agency: NA        Social Determinants of Health (SDOH) Interventions     Readmission Risk Interventions Readmission Risk Prevention Plan 09/21/2020  Post Dischage Appt Complete  Medication Screening Complete  Transportation Screening Complete  Some recent data might be hidden

## 2020-09-21 NOTE — Progress Notes (Signed)
SATURATION QUALIFICATIONS:  Patient Saturations on Room Air at Rest = 94%  Patient Saturations on Room Air while Ambulating = 90-92%

## 2020-09-21 NOTE — Progress Notes (Signed)
D/c tele and IV. Went over AVS with pt and all questions were answered. Waiting for her ride to pick pt up.  Lawson Radar, RN

## 2020-09-22 LAB — TYPE AND SCREEN
ABO/RH(D): O POS
Antibody Screen: NEGATIVE
Unit division: 0

## 2020-09-22 LAB — BPAM RBC
Blood Product Expiration Date: 202206022359
Unit Type and Rh: 5100

## 2020-09-26 ENCOUNTER — Telehealth: Payer: Self-pay | Admitting: *Deleted

## 2020-09-26 NOTE — Telephone Encounter (Signed)
Patient called c/o "very bad" headache, requesting additional pain medication.  She states that she has two percocets remaining from her discharge from surgery.  Patient states that she drove today and lifted a large bag of birdseed.  I reminded her of the post op instructions.  Patient is to call tomorrow if headache worsens.

## 2020-10-20 ENCOUNTER — Ambulatory Visit (HOSPITAL_COMMUNITY)
Admission: RE | Admit: 2020-10-20 | Discharge: 2020-10-20 | Disposition: A | Discharge status: Home / Self Care | Payer: Medicare PPO | Source: Ambulatory Visit | Attending: Vascular Surgery | Admitting: Vascular Surgery

## 2020-10-20 ENCOUNTER — Ambulatory Visit (INDEPENDENT_AMBULATORY_CARE_PROVIDER_SITE_OTHER): Payer: Medicare PPO | Admitting: Physician Assistant

## 2020-10-20 ENCOUNTER — Other Ambulatory Visit: Payer: Self-pay

## 2020-10-20 VITALS — BP 149/87 | HR 84 | Temp 98.2°F | Resp 20 | Ht 61.0 in | Wt 137.6 lb

## 2020-10-20 DIAGNOSIS — I6523 Occlusion and stenosis of bilateral carotid arteries: Secondary | ICD-10-CM | POA: Insufficient documentation

## 2020-10-20 DIAGNOSIS — Z720 Tobacco use: Secondary | ICD-10-CM

## 2020-10-20 NOTE — Progress Notes (Signed)
    Postoperative Visit   History of Present Illness   Isabella [name] is a 55 y.o. female who presents for postoperative follow-up for: Right transcarotid artery revascularization by Dr. Darrick Penna on 09/19/2020.  She had severe headaches in the first few days after discharge from the hospital however these have resolved.  She denies any strokelike symptoms including slurring speech, changes in vision, or one-sided weakness.  She previously had a right common carotid stent placed in 2019.  She is also had innominate bypass in [place] many years ago.  Prior to that she had right carotid endarterectomy in [place] and left carotid endarterectomy by Dr. Darrick Penna and distant history.  She says she is can taking her aspirin and Plavix daily.  She continues to smoke and has no interest in quitting.    Current Outpatient Medications  Medication Sig Dispense Refill   aspirin EC 81 MG tablet Take 81 mg by mouth daily. Swallow whole.     BD PEN NEEDLE NANO 2ND GEN 32G X 4 MM MISC      calcium-vitamin D (OSCAL WITH D) 500-200 MG-UNIT tablet Take 1 tablet by mouth 3 (three) times a week.     clopidogrel (PLAVIX) 75 MG tablet TAKE 1 TABLET BY MOUTH EVERY DAY (Patient taking differently: Take 75 mg by mouth daily.) 90 tablet 2   ezetimibe (ZETIA) 10 MG tablet Take 10 mg by mouth daily.     glucose blood (PRECISION QID TEST) test strip 1 strip by Other route every day     insulin glargine (LANTUS) 100 UNIT/ML Solostar Pen Inject 32 Units into the skin at bedtime.     oxyCODONE-acetaminophen (PERCOCET) 5-325 MG tablet Take 1 tablet by mouth every 6 (six) hours as needed for severe pain. 8 tablet 0   No current facility-administered medications for this visit.    For VQI Use Only   PRE-ADM LIVING: Home  AMB STATUS: Ambulatory   Physical Examination   Vitals:   10/20/20 1443 10/20/20 1446  BP: 138/72 (!) 149/87  Pulse: 84   Resp: 20   Temp: 98.2 F (36.8 C)   TempSrc: Temporal   SpO2: 97%   Weight:  137 lb 9.6 oz (62.4 kg)   Height: 5\' 1"  (1.549 m)     right Neck: Incision is healed;  R groin vein puncture site without hematoma Neuro: CN 2-12 are grossly intact   Medical Decision Making   Heidi Maclin is a 55 y.o. female who presents s/p right TCAR.  Postoperative headaches however these have resolved; she denies any neurological events since surgery Carotid duplex demonstrates a widely patent right ICA stent Continue aspirin and Plavix daily Encourage smoking cessation Recheck carotid duplex in 1 year  57 PA-C [facility] [place] Office: [phone]  Clinic MD: [phone]

## 2022-03-11 IMAGING — CT CT ANGIO HEAD
1 of 10 series · 6 of 33 positions shown · IV contrast (omnipaque)
Comparison: CT angiogram head/neck 06/21/2017.

CLINICAL DATA: Carotid stenosis, bilateral. Carotid stenosis
screening, no risk factors.

EXAM:
CT ANGIOGRAPHY HEAD AND NECK
TECHNIQUE: Multidetector CT imaging of the head and neck was performed using
the standard protocol during bolus administration of intravenous
contrast. Multiplanar CT image reconstructions and MIPs were
obtained to evaluate the vascular anatomy. Carotid stenosis
measurements (when applicable) are obtained utilizing NASCET
criteria, using the distal internal carotid diameter as the
denominator.
CONTRAST:  100mL OMNIPAQUE IOHEXOL 350 MG/ML SOLN

[Series 10: ax thin · axial · 0.49mm/px · z∈[+1232,+1519]mm · 6 of 409 slices shown]
[im 59/409  soft-tissue]
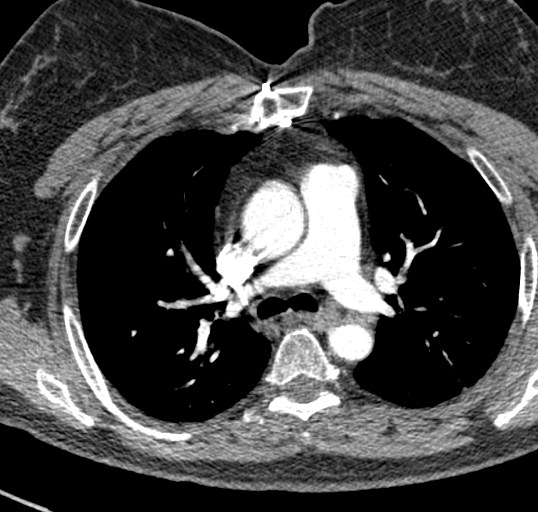
[im 117/409  bone]
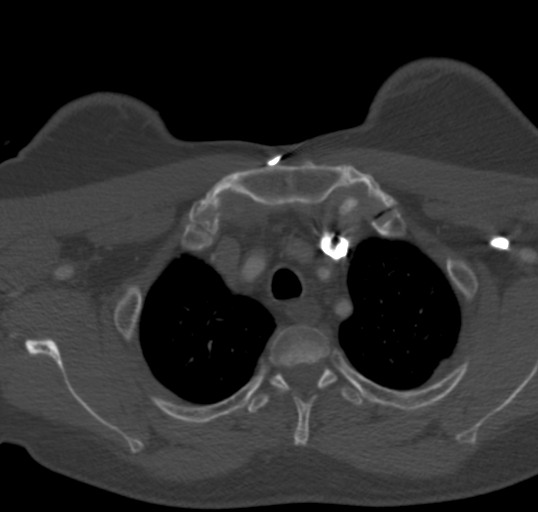
[im 175/409  soft-tissue]
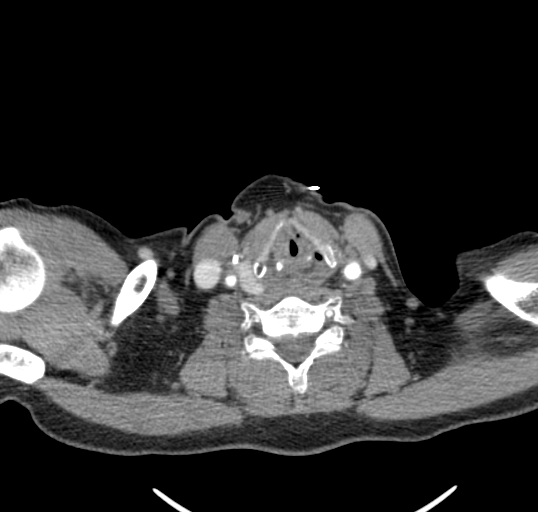
[im 234/409  bone]
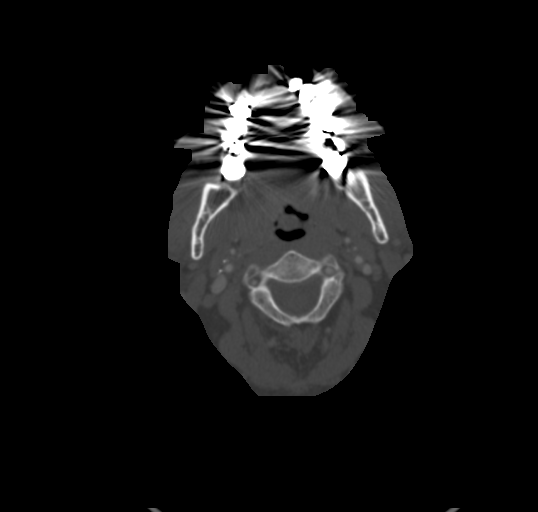
[im 292/409  soft-tissue]
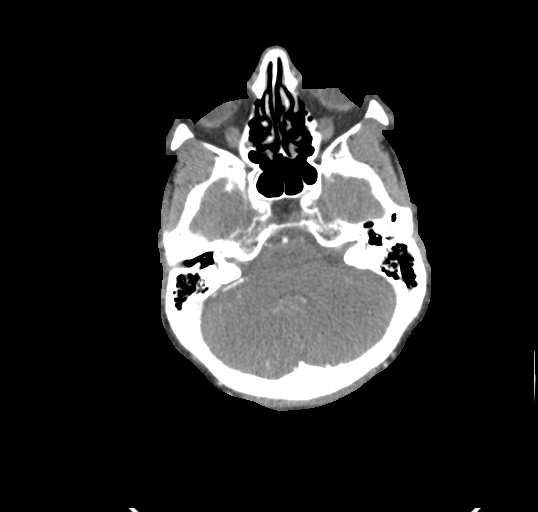
[im 350/409  bone]
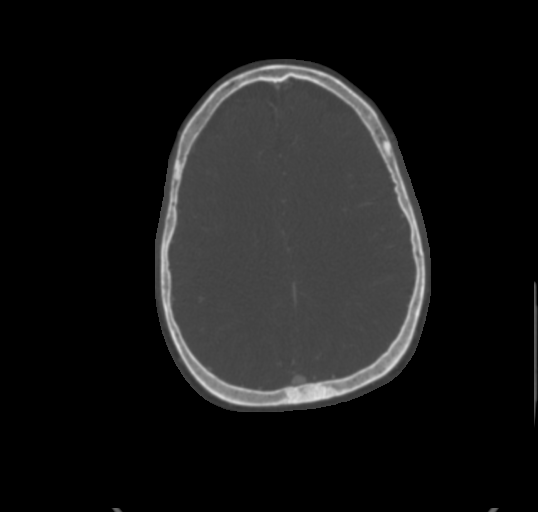

[6 of 33 positions shown; findings below may reference images not displayed]

FINDINGS: CT HEAD FINDINGS

Brain:

The examination is motion degraded at the level of the vertex.

Cerebral volume is normal.

No acute intracranial hemorrhage or demarcated cortical infarct is
identified.

No extra-axial fluid collection.

No evidence of intracranial mass.

No midline shift.

Vascular: Reported below.

Skull: Normal. Negative for fracture or focal lesion.

Sinuses: Ethmoid sinus mucosal thickening. Left maxillary sinus
mucous retention cyst. No significant mastoid effusion.

Orbits: No acute abnormality.

Review of the MIP images confirms the above findings

CTA NECK FINDINGS

Aortic arch: Standard aortic branching. Atherosclerotic plaque
within the visualized aortic arch and proximal major branch vessels
of the neck. No hemodynamically significant innominate or proximal
subclavian artery stenosis.

Right carotid system: New from the prior examination of 06/21/2017,
there is a vascular stent within the proximal to mid right common
carotid artery. Motion artifact precludes adequate evaluation for
intra stent stenosis within the proximal aspect of the stent. Flow
related enhancement is seen within the remainder of the CCA stent.
There is no hemodynamically significant stenosis within the
remainder of the CCA or carotid bifurcation. New from the prior
examination, there is a high-grade near occlusive short-segment
stenosis within the proximal ICA with radiographic string sign
(series 10, image 24). Distal to this, the cervical ICA is patent
within the neck without hemodynamically significant stenosis.

Left carotid system: There has been interval left carotid
endarterectomy. The left CCA and ICA are now patent within the neck
without measurable stenosis.

Vertebral arteries: Patent within the neck bilaterally. The left
vertebral artery is dominant. As before, there is multifocal
mild-to-moderate narrowing of the V2 right vertebral artery.

Skeleton: No acute bony abnormality or aggressive osseous lesion.
Cervical spondylosis greatest at C6-C7.

Other neck: No neck mass or cervical lymphadenopathy. Thyroid
unremarkable.

Upper chest: Prior median sternotomy. No consolidation within the
imaged lung apices.

Review of the MIP images confirms the above findings

CTA HEAD FINDINGS

Anterior circulation:

The intracranial internal carotid arteries are patent. Calcified
plaque within both vessels. As before, there is moderate/severe
stenosis of the distal cavernous/paraclinoid segments bilaterally.

The M1 middle cerebral arteries are patent. Redemonstrated mild
narrowing of the distal M1 right MCA. As before, there is
atherosclerotic irregularity of the M2 and more distal MCA branch
vessels bilateral. However, no M2 proximal branch occlusion or
high-grade proximal arterial stenosis is identified.

The anterior cerebral arteries are patent.

No intracranial aneurysm is identified.

Posterior circulation:

The non dominant intracranial right vertebral artery is
developmentally diminutive, but patent. The dominant intracranial
left vertebral artery is patent without significant stenosis, as is
the basilar artery. The posterior cerebral arteries are patent
bilaterally without high-grade proximal stenosis. Posterior
communicating arteries are hypoplastic or absent bilaterally.

Venous sinuses: Within limitations of contrast timing, no convincing
thrombus.

Anatomic variants: As described

Review of the MIP images confirms the above findings

Impression #2 under the CTA neck impression section below will be
called to the ordering clinician or representative by the
Radiologist Assistant, and communication documented in the PACS or
[REDACTED].
IMPRESSION: CT head:

1. Motion degraded examination.
2. Within this limitation, there is an unremarkable non-contrast CT
appearance of the brain. No evidence of acute intracranial
abnormality.
3. Mild ethmoid sinus mucosal thickening.
4. Left maxillary sinus mucous retention cyst.

CTA neck:

1. There has been interval placement of a stent within the proximal
to mid right common carotid artery. Motion artifact precludes
adequate evaluation for intra-stent stenosis within the proximal
aspect of the stent. Flow-related enhancement is appreciated within
the remainder of the stent.
2. Interval development of a high-grade near-occlusive short-segment
stenosis of the proximal right ICA with radiographic string sign.
Distal to this, the right ICA is patent within the neck without
hemodynamically significant stenosis.
3. Interval left carotid endarterectomy. The left CCA and ICA are
now patent within the neck without measurable stenosis.
4. The vertebral arteries are patent within the neck bilaterally. As
before, there is multifocal mild-to-moderate stenosis of the V2
right vertebral artery.

CTA head:

1. No intracranial large vessel occlusion.
2. Redemonstrated moderate/severe stenoses of the distal
cavernous/paraclinoid internal carotid arteries bilaterally.

## 2022-09-12 ENCOUNTER — Other Ambulatory Visit: Payer: Self-pay | Admitting: *Deleted

## 2022-09-12 DIAGNOSIS — I6523 Occlusion and stenosis of bilateral carotid arteries: Secondary | ICD-10-CM

## 2022-09-13 ENCOUNTER — Ambulatory Visit (INDEPENDENT_AMBULATORY_CARE_PROVIDER_SITE_OTHER): Payer: Medicare PPO | Admitting: Physician Assistant

## 2022-09-13 ENCOUNTER — Ambulatory Visit (HOSPITAL_COMMUNITY)
Admission: RE | Admit: 2022-09-13 | Discharge: 2022-09-13 | Disposition: A | Discharge status: Home / Self Care | Payer: Medicare PPO | Source: Ambulatory Visit | Attending: Vascular Surgery | Admitting: Vascular Surgery

## 2022-09-13 VITALS — BP 117/78 | HR 81 | Temp 98.1°F | Resp 20 | Ht 61.0 in | Wt 131.0 lb

## 2022-09-13 DIAGNOSIS — I6523 Occlusion and stenosis of bilateral carotid arteries: Secondary | ICD-10-CM

## 2022-09-13 DIAGNOSIS — Z720 Tobacco use: Secondary | ICD-10-CM | POA: Diagnosis not present

## 2022-09-13 NOTE — Progress Notes (Signed)
Office Note   History of Present Illness   Isabella [name] is a 57 y.o. (Apr 02, 1966) female who presents for surveillance of carotid artery stenosis.  Most recently she underwent right TCAR by Dr. Darrick Penna on 09/19/2020.  This was done for repeat critical stenosis of previous carotid stent.  Her first right carotid stent was placed in 2019 also by Dr. Darrick Penna.  Prior to that she had a right carotid endarterectomy in [place].  She also has a history of left carotid endarterectomy.  She has a remote history of stroke.  She also has a remote history of innominate bypass in [place].  She returns today for follow-up.  She denies any recent CVA or TIA diagnosis.  She denies any strokelike symptoms such as slurred speech, facial droop, sudden visual changes, or sudden weakness/numbness.  She also denies any rest pain, claudication, nonhealing wounds of the lower extremities.   She reports some genetic issue that runs in her family that "makes them buildup a lot of cholesterol and occlude stents".  Her sister has a history of stroke and MI.  She reports premature death of one of her female cousin is due to a cardiac event.  She continues to smoke.  She states that she is trying to quit smoking but then she gains "a lot of weight".  She states that she would rather smoke than be overweight.  She takes her aspirin and Plavix daily.  Current Outpatient Medications  Medication Sig Dispense Refill   alendronate (FOSAMAX) 70 MG tablet Take 70 mg by mouth once a week.     aspirin EC 81 MG tablet Take 81 mg by mouth daily. Swallow whole.     calcium-vitamin D (OSCAL WITH D) 500-200 MG-UNIT tablet Take 1 tablet by mouth 3 (three) times a week.     clopidogrel (PLAVIX) 75 MG tablet TAKE 1 TABLET BY MOUTH EVERY DAY (Patient taking differently: Take 75 mg by mouth daily.) 90 tablet 2   glucose blood (PRECISION QID TEST) test strip 1 strip by Other route every day     BD PEN NEEDLE NANO 2ND GEN 32G X 4 MM MISC   (Patient not taking: Reported on 09/13/2022)     No current facility-administered medications for this visit.    REVIEW OF SYSTEMS (negative unless checked):   Cardiac:  []  Chest pain or chest pressure? []  Shortness of breath upon activity? []  Shortness of breath when lying flat? []  Irregular heart rhythm?  Vascular:  []  Pain in calf, thigh, or hip brought on by walking? []  Pain in feet at night that wakes you up from your sleep? []  Blood clot in your veins? []  Leg swelling?  Pulmonary:  []  Oxygen at home? []  Productive cough? []  Wheezing?  Neurologic:  []  Sudden weakness in arms or legs? []  Sudden numbness in arms or legs? []  Sudden onset of difficult speaking or slurred speech? []  Temporary loss of vision in one eye? []  Problems with dizziness?  Gastrointestinal:  []  Blood in stool? []  Vomited blood?  Genitourinary:  []  Burning when urinating? []  Blood in urine?  Psychiatric:  []  Major depression  Hematologic:  []  Bleeding problems? []  Problems with blood clotting?  Dermatologic:  []  Rashes or ulcers?  Constitutional:  []  Fever or chills?  Ear/Nose/Throat:  []  Change in hearing? []  Nose bleeds? []  Sore throat?  Musculoskeletal:  []  Back pain? []  Joint pain? []  Muscle pain?   Physical Examination   Vitals:   09/13/22 1417  BP: 117/78  Pulse: 81  Resp: 20  Temp: 98.1 F (36.7 C)  SpO2: 94%  Weight: 131 lb (59.4 kg)  Height: 5\' 1"  (1.549 m)   Body mass index is 24.75 kg/m.  General:  WDWN in NAD; vital signs documented above Gait: Not observed HENT: WNL, normocephalic Pulmonary: normal non-labored breathing  Cardiac: regular, right carotid bruit Abdomen: soft, NT, no masses Skin: without rashes Vascular Exam/Pulses: Palpable radial pulses bilaterally Extremities: without ischemic changes, without Gangrene , without cellulitis; without open wounds;  Musculoskeletal: no muscle wasting or atrophy  Neurologic: A&O X 3;  No focal  weakness or paresthesias are detected Psychiatric:  The pt has Normal affect.  Non-Invasive Vascular Imaging   Carotid Duplex (09/14/2022):  R ICA stenosis: Patent right ICA stent with 50 to 75% distal stent stenosis.  PSV 300 cm/s R VA:  patent and antegrade L ICA stenosis:  1-39% L VA:  patent and antegrade   Medical Decision Making   Isabella [name] is a 57 y.o. female who presents for surveillance of carotid artery stenosis  Based on the patient's vascular studies, her left ICA stenosis is unchanged at 1 to 39%.  It appears since her last right TCAR in May 2022 she has developed repeat stenosis in her right ICA stent again.  There is distal stent stenosis rated 50 to 75%, with peak systolic velocity 300 cm/s She denies any strokelike symptoms such as slurred speech, facial droop, sudden weakness/numbness, sudden visual changes.  She is compliant with her aspirin and Plavix.  She states that she is intolerant to statins.  She is going to reach out to her PCP to try Repatha. After the patient's first right TCAR in 2019, she eventually developed 90% stenosis of the stent with peak systolic velocities in the 600s.  This is what prompted her to require repeat TCAR in 2022. Given that the patient is currently asymptomatic and her stenosis is around 50%, we will continue to monitor this closely.  I have encouraged the patient to quit smoking again.  She will continue her aspirin and Plavix.  Hopefully she can begin a new cholesterol medication. She can follow-up with our office in 6 months with repeat carotid duplex   Loel Dubonnet PA-C [facility] [place] Office: [phone]  Call MD: Karin Lieu

## 2022-09-21 ENCOUNTER — Other Ambulatory Visit: Payer: Self-pay

## 2022-09-21 DIAGNOSIS — I6523 Occlusion and stenosis of bilateral carotid arteries: Secondary | ICD-10-CM

## 2023-03-18 ENCOUNTER — Ambulatory Visit (INDEPENDENT_AMBULATORY_CARE_PROVIDER_SITE_OTHER): Payer: Medicare PPO | Admitting: Physician Assistant

## 2023-03-18 ENCOUNTER — Ambulatory Visit (HOSPITAL_COMMUNITY)
Admission: RE | Admit: 2023-03-18 | Discharge: 2023-03-18 | Disposition: A | Discharge status: Home / Self Care | Payer: Medicare PPO | Source: Ambulatory Visit | Attending: Vascular Surgery | Admitting: Vascular Surgery

## 2023-03-18 VITALS — BP 119/79 | HR 97 | Temp 98.2°F | Ht 61.0 in | Wt 133.0 lb

## 2023-03-18 DIAGNOSIS — I6523 Occlusion and stenosis of bilateral carotid arteries: Secondary | ICD-10-CM | POA: Diagnosis not present

## 2023-03-18 DIAGNOSIS — Z720 Tobacco use: Secondary | ICD-10-CM

## 2023-03-18 NOTE — Progress Notes (Unsigned)
Office Note     CC:  follow up Requesting Provider:  [name], MD  HPI: Isabella [name] is a 57 y.o. (1965/08/01) female who presents for routine follow up of carotid artery stenosis. She is s/p right TCAR on 09/19/20 by Dr. Darrick Penna. This was done for repeat stenosis of previous carotid stenting. Her initial right carotid stent was placed in 2019 by Dr. Darrick Penna. Prior to that she had remote right [place]. She has also undergone innominate  artery angioplasty and subsequent bypass surgery in [place]. She also has history of left CEA in 2019 by Dr. Edilia Bo. This was for asymptomatic high grade stenosis. She does have history of stroke.    She is here today for follow up. She says she had a TIA in June and woke up unable to speak. She continues to have intermittent dizziness, word finding difficulties, and memory loss. She continues work. She denies any pain in her legs on ambulation or rest. No tissue loss. She does complain of frequent cramping especially in her right calf. This occurs when she is first waking up and is stretching her legs. This does not occur on ambulation. She feels she drinks plenty of water. She is medically managed on Aspirin and Plavix. She has a statin intolerance. She is an everyday smoker.   Past Medical History:  Diagnosis Date   Anemia    Arthritis    "lower back, knees, elbows"   Carotid artery occlusion    Complication of anesthesia 2013   Patient stated that she could not  "wake up from anesthesia and coded" during aorta surgery in [facility]; by account likely post-op aspiration with respiratory distress/reintubation 11/2011   COPD (chronic obstructive pulmonary disease) (HCC)    patient states she has been diagnosed with COPD "but does not use any inhalers"   Coronary artery disease    Diabetes mellitus without complication (HCC)    Type II   Fatty liver    GERD (gastroesophageal reflux disease)    diet controlled   History of hiatal hernia    History  of kidney stones    HPV in female    Peripheral vascular disease (HCC)    Pneumonia    Poison sumac    acquired it approx on Friday.  On back, below left shoulder and under breasts   Stroke (HCC) 2014   , expressive aphisa at times, Left hand cannot close    Past Surgical History:  Procedure Laterality Date   ABDOMINAL AORTOGRAM W/LOWER EXTREMITY N/A 08/16/2017   Procedure: ABDOMINAL AORTOGRAM W/LOWER EXTREMITY;  Surgeon: Chuck Hint, MD;  Location: [facility] INVASIVE CV LAB;  Service: Cardiovascular;  Laterality: N/A;   ABDOMINAL HYSTERECTOMY     AORTA SURGERY     with sent placement   AORTIC ARCH ANGIOGRAPHY  08/16/2017   Procedure: AORTIC ARCH ANGIOGRAPHY/ INNOMINTE BYPASS ANGIOGRAPHY;  Surgeon: Chuck Hint, MD;  Location: [facility] INVASIVE CV LAB;  Service: Cardiovascular;;   CAROTID ENDARTERECTOMY Right    CYSTOCELE REPAIR     DILATION AND CURETTAGE OF UTERUS     ENDARTERECTOMY Left 06/25/2017   Procedure: LEFT CAROTID ENDARTERECTOMY;  Surgeon: Chuck Hint, MD;  Location: [facility] OR;  Service: Vascular;  Laterality: Left;   HEMATOMA EVACUATION Right 12/18/2017   Procedure: EVACUATION HEMATOMA;  Surgeon: Nada Libman, MD;  Location: [facility] OR;  Service: Vascular;  Laterality: Right;   INSERTION OF RETROGRADE CAROTID STENT Right 12/18/2017   Procedure: INSERTION OF RETROGRADE  CAROTID STENT;  Surgeon: Sherren Kerns, MD;  Location: [facility] OR;  Service: Vascular;  Laterality: Right;   PATCH ANGIOPLASTY Left 06/25/2017   Procedure: PATCH ANGIOPLASTY OF LEFT CAROTID ARTERY USING HEMASHIELD PALTINUM FINESSE PATCH;  Surgeon: Chuck Hint, MD;  Location[facility] OR;  Service: Vascular;  Laterality: Left;   RECTOCELE REPAIR     x2   TRANSCAROTID ARTERY REVASCULARIZATION  Right 09/19/2020   Procedure: RIGHT TRANSCAROTID ARTERY REVASCULARIZATION;  Surgeon: Sherren Kerns, MD;  Location: [facility] OR;  Service: Vascular;  Laterality: Right;    Social History   Socioeconomic History    Marital status: Divorced    Spouse name: Not on file   Number of children: Not on file   Years of education: Not on file   Highest education level: Not on file  Occupational History   Not on file  Tobacco Use   Smoking status: Every Day    Current packs/day: 1.50    Types: Cigarettes   Smokeless tobacco: Never  Vaping Use   Vaping status: Never Used  Substance and Sexual Activity   Alcohol use: No   Drug use: No   Sexual activity: Not on file  Other Topics Concern   Not on file  Social History Narrative   Not on file   Social Determinants of Health   Financial Resource Strain: Not on file  Food Insecurity: Not on file  Transportation Needs: Not on file  Physical Activity: Sufficiently Active (06/01/2022)   [facility]   Exercise Vital Sign    Days of Exercise per Week: 7 days    Minutes of Exercise per Session: 50 min  Stress: Not on file  Social Connections: Not on file  Intimate Partner Violence: Not on file   *** Family History  Problem Relation Age of Onset   AAA (abdominal aortic aneurysm) Sister    Peripheral Artery Disease Brother     Current Outpatient Medications  Medication Sig Dispense Refill   alendronate (FOSAMAX) 70 MG tablet Take 70 mg by mouth once a week.     aspirin EC 81 MG tablet Take 81 mg by mouth daily. Swallow whole.     BD PEN NEEDLE NANO 2ND GEN 32G X 4 MM MISC  (Patient not taking: Reported on 09/13/2022)     calcium-vitamin D (OSCAL WITH D) 500-200 MG-UNIT tablet Take 1 tablet by mouth 3 (three) times a week.     clopidogrel (PLAVIX) 75 MG tablet TAKE 1 TABLET BY MOUTH EVERY DAY (Patient taking differently: Take 75 mg by mouth daily.) 90 tablet 2   glucose blood (PRECISION QID TEST) test strip 1 strip by Other route every day     No current facility-administered medications for this visit.    Allergies  Allergen Reactions   Statins Other (See Comments)    Muscle weakness Arm Pain Twitching       REVIEW OF SYSTEMS:  *** [X]  denotes positive finding, [ ]  denotes negative finding Cardiac  Comments:  Chest pain or chest pressure:    Shortness of breath upon exertion:    Short of breath when lying flat:    Irregular heart rhythm:        Vascular    Pain in calf, thigh, or hip brought on by ambulation:    Pain in feet at night that wakes you up from your sleep:     Blood clot in your veins:    Leg swelling:  Pulmonary    Oxygen at home:    Productive cough:     Wheezing:         Neurologic    Sudden weakness in arms or legs:     Sudden numbness in arms or legs:     Sudden onset of difficulty speaking or slurred speech:    Temporary loss of vision in one eye:     Problems with dizziness:         Gastrointestinal    Blood in stool:     Vomited blood:         Genitourinary    Burning when urinating:     Blood in urine:        Psychiatric    Major depression:         Hematologic    Bleeding problems:    Problems with blood clotting too easily:        Skin    Rashes or ulcers:        Constitutional    Fever or chills:      PHYSICAL EXAMINATION:  Vitals:   03/18/23 1247  BP: 119/79  Pulse: 97  Temp: 98.2 F (36.8 C)  SpO2: 97%  Weight: 133 lb (60.3 kg)  Height: 5\' 1"  (1.549 m)    General:  WDWN in NAD; vital signs documented above Gait: Normal HENT: WNL, normocephalic Pulmonary: normal non-labored breathing , with wheezing Cardiac: regular HR Abdomen: soft Vascular Exam/Pulses: 2+ femoral, 2+ DP pulses bilaterally Extremities: without ischemic changes, without Gangrene , without cellulitis; without open wounds;  Musculoskeletal: no muscle wasting or atrophy  Neurologic: A&O X 3 Psychiatric:  The pt has Normal affect.   Non-Invasive Vascular Imaging:   VAS US Carotid: Summary:  Right Carotid: >75% stenosis noted in the distal stent (EDV 128 cm/s)  Left Carotid: Velocities in the left ICA are consistent with a 40-59% stenosis.    Vertebrals: Bilateral vertebral arteries demonstrate antegrade flow.    ASSESSMENT/PLAN:: 56 y.o. female here for follow up for ***  - discussed importance of smoking cessation but she says " its the only thing that calms her nerves" - She would certainly benefit from getting on Repatha or other injectable statin with her history. Apparently she is changing insurance so she can get coverage for this - Continue Aspirin and Plavix - I am setting her up for CTA Neck to look at her right ICA stent. I will have her follow up with Dr. Randie Heinz to review these findings and discuss with her management.   Graceann Congress, PA-C [facility] [phone]  Clinic MD:   Myra Gianotti

## 2023-03-19 ENCOUNTER — Encounter: Payer: Self-pay | Admitting: Physician Assistant

## 2023-04-03 ENCOUNTER — Other Ambulatory Visit: Payer: Self-pay

## 2023-04-03 DIAGNOSIS — I6523 Occlusion and stenosis of bilateral carotid arteries: Secondary | ICD-10-CM

## 2023-04-18 ENCOUNTER — Ambulatory Visit
Admission: RE | Admit: 2023-04-18 | Discharge: 2023-04-18 | Disposition: A | Discharge status: Home / Self Care | Payer: Medicare PPO | Source: Ambulatory Visit | Attending: Surgery | Admitting: Surgery

## 2023-04-18 DIAGNOSIS — I6523 Occlusion and stenosis of bilateral carotid arteries: Secondary | ICD-10-CM

## 2023-04-18 MED ORDER — IOPAMIDOL (ISOVUE-370) INJECTION 76%
75.0000 mL | Freq: Once | INTRAVENOUS | Status: AC | PRN
  Administered 2023-04-18: 75 mL via INTRAVENOUS

## 2023-05-01 ENCOUNTER — Ambulatory Visit: Payer: Medicare PPO | Admitting: Vascular Surgery

## 2023-06-12 ENCOUNTER — Encounter: Payer: Self-pay | Admitting: Vascular Surgery

## 2023-06-12 ENCOUNTER — Ambulatory Visit (INDEPENDENT_AMBULATORY_CARE_PROVIDER_SITE_OTHER): Payer: 59 | Admitting: Vascular Surgery

## 2023-06-12 VITALS — BP 160/60 | HR 109 | Temp 97.9°F | Resp 20 | Ht 61.0 in | Wt 133.0 lb

## 2023-06-12 DIAGNOSIS — I6523 Occlusion and stenosis of bilateral carotid arteries: Secondary | ICD-10-CM | POA: Diagnosis not present

## 2023-06-12 NOTE — Progress Notes (Signed)
Patient ID: Isabella [name], female   DOB: 1966-03-17, 58 y.o.   MRN: [id number]  Reason for Consult: Follow-up   Referred by [name], MD  Subjective:     HPI:  Isabella [name] is a 58 y.o. female with extensive vascular history including most recently right carotid artery stenting via transcarotid artery route by Dr. Darrick Penna in 2022 and also initially had right common carotid artery stenting with a VBX via direct exposure of the right common carotid artery and also has history of left carotid endarterectomy by Dr. Edilia Bo in 2019.  Before that she right carotid endarterectomy in [place] and also had innominate artery stenting with subsequent innominate artery bypass at the time of coronary artery bypass grafting performed in [facility].  At last follow-up she was noted to have stenosis on carotid duplex now follows up with CT angio.  She states that she did have an episode of TIA last June but has not had any further stroke, TIA or amaurosis.  She continues to smoke at least 1 pack/day.  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  She has intolerance to statins and has tried to take Repatha but was denied by insurance.  Past Medical History:  Diagnosis Date   Anemia    Arthritis    "lower back, knees, elbows"   Carotid artery occlusion    Complication of anesthesia 2013   Patient stated that she could not  "wake up from anesthesia and coded" during aorta surgery in [facility]; by account likely post-op aspiration with respiratory distress/reintubation 11/2011   COPD (chronic obstructive pulmonary disease) (HCC)    patient states she has been diagnosed with COPD "but does not use any inhalers"   Coronary artery disease    Diabetes mellitus without complication (HCC)    Type II   Fatty liver    GERD (gastroesophageal reflux disease)    diet controlled   History of hiatal hernia    History of kidney  stones    HPV in female    Peripheral vascular disease (HCC)    Pneumonia    Poison sumac    acquired it approx on Friday.  On back, below left shoulder and under breasts   Stroke (HCC) 2014   , expressive aphisa at times, Left hand cannot close   Family History  Problem Relation Age of Onset   AAA (abdominal aortic aneurysm) Sister    Peripheral Artery Disease Brother    Past Surgical History:  Procedure Laterality Date   ABDOMINAL AORTOGRAM W/LOWER EXTREMITY N/A 08/16/2017   Procedure: ABDOMINAL AORTOGRAM W/LOWER EXTREMITY;  Surgeon: Chuck Hint, MD;  Location: [facility] INVASIVE CV LAB;  Service: Cardiovascular;  Laterality: N/A;   ABDOMINAL HYSTERECTOMY     AORTA SURGERY     with sent placement   AORTIC ARCH ANGIOGRAPHY  08/16/2017   Procedure: AORTIC ARCH ANGIOGRAPHY/ INNOMINTE BYPASS ANGIOGRAPHY;  Surgeon: Chuck Hint, MD;  Location: [facility] INVASIVE CV LAB;  Service: Cardiovascular;;   CAROTID ENDARTERECTOMY Right    CYSTOCELE REPAIR     DILATION AND CURETTAGE OF UTERUS     ENDARTERECTOMY Left 06/25/2017   Procedure: LEFT CAROTID ENDARTERECTOMY;  Surgeon: Chuck Hint, MD;  Location: [facility] OR;  Service: Vascular;  Laterality: Left;   HEMATOMA EVACUATION Right 12/18/2017   Procedure: EVACUATION HEMATOMA;  Surgeon: Nada Libman, MD;  Location: [facility] OR;  Service:  Vascular;  Laterality: Right;   INSERTION OF RETROGRADE CAROTID STENT Right 12/18/2017   Procedure: INSERTION OF RETROGRADE CAROTID STENT;  Surgeon: Sherren Kerns, MD;  Location: [facility] OR;  Service: Vascular;  Laterality: Right;   PATCH ANGIOPLASTY Left 06/25/2017   Procedure: PATCH ANGIOPLASTY OF LEFT CAROTID ARTERY USING HEMASHIELD PALTINUM FINESSE PATCH;  Surgeon: Chuck Hint, MD;  Location: [facility] OR;  Service: Vascular;  Laterality: Left;   RECTOCELE REPAIR     x2   TRANSCAROTID ARTERY REVASCULARIZATION  Right 09/19/2020   Procedure: RIGHT TRANSCAROTID ARTERY REVASCULARIZATION;  Surgeon: Sherren Kerns, MD;  Location: [facility] OR;  Service: Vascular;  Laterality: Right;    Short Social History:  Social History   Tobacco Use   Smoking status: Every Day    Current packs/day: 1.50    Types: Cigarettes   Smokeless tobacco: Never  Substance Use Topics   Alcohol use: No    Allergies  Allergen Reactions   Statins Other (See Comments)    Muscle weakness Arm Pain Twitching     Current Outpatient Medications  Medication Sig Dispense Refill   alendronate (FOSAMAX) 70 MG tablet Take 70 mg by mouth once a week.     aspirin EC 81 MG tablet Take 81 mg by mouth daily. Swallow whole.     calcium-vitamin D (OSCAL WITH D) 500-200 MG-UNIT tablet Take 1 tablet by mouth 3 (three) times a week.     clopidogrel (PLAVIX) 75 MG tablet TAKE 1 TABLET BY MOUTH EVERY DAY (Patient taking differently: Take 75 mg by mouth daily.) 90 tablet 2   glucose blood (PRECISION QID TEST) test strip 1 strip by Other route every day     BD PEN NEEDLE NANO 2ND GEN 32G X 4 MM MISC  (Patient not taking: Reported on 09/13/2022)     No current facility-administered medications for this visit.    Review of Systems  Constitutional: Positive for fatigue.  HENT: HENT negative.  Respiratory: Positive for shortness of breath.  Cardiovascular: Cardiovascular negative.  GI: Gastrointestinal negative.  Musculoskeletal: Musculoskeletal negative.  Skin: Skin negative.  Neurological: Positive for speech difficulty.  Hematologic: Hematologic/lymphatic negative.  Psychiatric: Psychiatric negative.        Objective:  Objective   Vitals:   06/12/23 1426  BP: (!) 160/60  Pulse: (!) 109  Resp: 20  Temp: 97.9 F (36.6 C)  SpO2: 93%     Physical Exam HENT:     Nose: Nose normal.     Mouth/Throat:     Comments: Missing top middle teeth Eyes:     Pupils: Pupils are equal, round, and reactive to light.  Neck:     Comments: Bilateral neck incisions well-healed Pulmonary:     Effort: Respiratory distress present.   Abdominal:     General: Abdomen is flat.  Skin:    General: Skin is warm.     Capillary Refill: Capillary refill takes less than 2 seconds.  Neurological:     General: No focal deficit present.     Mental Status: She is alert.  Psychiatric:        Mood and Affect: Mood normal.     Data: CT IMPRESSION: 1. Previous median sternotomy and CABG. Aortic atherosclerosis. 2. Right innominate artery origin is widely patent. Question if this is a graft vessel. There is a stent within an occluded vessel extending towards that region that probably represents the occluded native innominate artery. There is soft plaque affecting the innominate artery just proximal  to the bifurcation but without stenosis more than about 20%. 3. Moderate stenosis of the proximal right common carotid artery proximal to the stent. There is motion degradation in this area unfortunately. The stent shows patent flow without evidence of intra stent stenosis. Distal to the stent, the common carotid artery is patent to the bifurcation region without significant stenosis. 4. Second carotid stent extending from the distal common carotid artery through the ICA bulb region. There appears to be intra stent stenosis towards the distal aspect of the stent that could be flow limiting. Beyond that, the small ICA is patent to the skull base. 5. Moderate stenosis of the proximal left common carotid artery. There is motion degradation in this location. Beyond the origin, the vessels shows plaque but is patent to the bifurcation region without focal flow limiting stenosis. There has probably been carotid endarterectomy and there is wide patency at the carotid bifurcation in through the ICA bulb. No focal or flow limiting stenosis seen distal to the endarterectomy region. 6. 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 The right vertebral artery origin is widely  patent. This is a small vessel but is patent through the cervical region, through the foramen magnum to the basilar artery. No stenosis of the left subclavian or vertebral.     Assessment/Plan:     58 year old female with extensive vascular history as above now with evidence of restenosis of the proximal right common carotid artery although there is significant motion degradation there she also has stenosis of the distal right ICA stent that was placed most recently via transcarotid artery route.  I have recommended that she needs urgent smoking cessation given her ongoing blockages and will refer her evaluation of Repatha.  We discussed the signs and symptoms of stroke which she demonstrates good understanding and sure that I will follow her back up in 6 months with repeat carotid duplex.     Maeola Harman MD [facility]

## 2023-06-27 ENCOUNTER — Other Ambulatory Visit: Payer: Self-pay

## 2023-06-27 DIAGNOSIS — I6523 Occlusion and stenosis of bilateral carotid arteries: Secondary | ICD-10-CM

## 2023-11-13 ENCOUNTER — Encounter (HOSPITAL_BASED_OUTPATIENT_CLINIC_OR_DEPARTMENT_OTHER): Payer: Self-pay

## 2023-11-27 ENCOUNTER — Ambulatory Visit (HOSPITAL_COMMUNITY): Payer: 59

## 2023-11-27 ENCOUNTER — Ambulatory Visit (HOSPITAL_COMMUNITY)
Admission: RE | Admit: 2023-11-27 | Discharge: 2023-11-27 | Disposition: A | Discharge status: Home / Self Care | Source: Ambulatory Visit | Attending: Vascular Surgery | Admitting: Vascular Surgery

## 2023-11-27 ENCOUNTER — Ambulatory Visit: Payer: 59 | Admitting: Vascular Surgery

## 2023-11-27 DIAGNOSIS — I6523 Occlusion and stenosis of bilateral carotid arteries: Secondary | ICD-10-CM | POA: Insufficient documentation

## 2024-01-01 ENCOUNTER — Ambulatory Visit: Admitting: Vascular Surgery

## 2024-02-12 ENCOUNTER — Ambulatory Visit: Attending: Vascular Surgery | Admitting: Vascular Surgery

## 2024-02-12 ENCOUNTER — Encounter: Payer: Self-pay | Admitting: Vascular Surgery

## 2024-02-12 VITALS — BP 158/75 | HR 93 | Temp 97.9°F | Ht 61.0 in | Wt 129.0 lb

## 2024-02-12 DIAGNOSIS — I6523 Occlusion and stenosis of bilateral carotid arteries: Secondary | ICD-10-CM

## 2024-02-12 DIAGNOSIS — Z720 Tobacco use: Secondary | ICD-10-CM

## 2024-02-12 NOTE — Progress Notes (Signed)
 Patient ID: Isabella [name], female   DOB: December 13, 1965, 58 y.o.   MRN: [id number]  Reason for Consult: Follow-up   Referred by Hollis Alan ORN, MD  Subjective:     HPI:  Isabella [name] is a 58 y.o. female with extensive vascular history including VBX stenting of the right common carotid artery and TCAR by Dr. Harvey in 2022.  She has additional left carotid endarterectomy in 2019.  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  She has a history of strokes no residual deficits.  No recent stroke, TIA or amaurosis.  She is on aspirin  she is concerned that Plavix  does not work given that she is adamant that she has no bruising despite being very active raising horses.  She continues to smoke over 1 pack/day.  She is now on Repatha.  Past Medical History:  Diagnosis Date   Anemia    Arthritis    lower back, knees, elbows   Carotid artery occlusion    Complication of anesthesia 2013   Patient stated that she could not  wake up from anesthesia and coded during aorta surgery in [place] ; by account likely post-op aspiration with respiratory distress/reintubation 11/2011   COPD (chronic obstructive pulmonary disease) (HCC)    patient states she has been diagnosed with COPD but does not use any inhalers   Coronary artery disease    Diabetes mellitus without complication (HCC)    Type II   Fatty liver    GERD (gastroesophageal reflux disease)    diet controlled   History of hiatal hernia    History of kidney stones    HPV in female    Peripheral vascular disease    Pneumonia    Poison sumac    acquired it approx on Friday.  On back, below left shoulder and under breasts   Stroke (HCC) 2014   , expressive aphisa at times, Left hand cannot close   Family History  Problem Relation Age of Onset   AAA (abdominal aortic aneurysm) Sister    Peripheral Artery Disease Brother    Past Surgical History:   Procedure Laterality Date   ABDOMINAL AORTOGRAM W/LOWER EXTREMITY N/A 08/16/2017   Procedure: ABDOMINAL AORTOGRAM W/LOWER EXTREMITY;  Surgeon: Eliza Lonni RAMAN, MD;  Location: [facility] INVASIVE CV LAB;  Service: Cardiovascular;  Laterality: N/A;   ABDOMINAL HYSTERECTOMY     AORTA SURGERY     with sent placement   AORTIC ARCH ANGIOGRAPHY  08/16/2017   Procedure: AORTIC ARCH ANGIOGRAPHY/ INNOMINTE BYPASS ANGIOGRAPHY;  Surgeon: Eliza Lonni RAMAN, MD;  Location: [facility] INVASIVE CV LAB;  Service: Cardiovascular;;   CAROTID ENDARTERECTOMY Right    CYSTOCELE REPAIR     DILATION AND CURETTAGE OF UTERUS     ENDARTERECTOMY Left 06/25/2017   Procedure: LEFT CAROTID ENDARTERECTOMY;  Surgeon: Eliza Lonni RAMAN, MD;  Location: [facility] OR;  Service: Vascular;  Laterality: Left;   HEMATOMA EVACUATION Right 12/18/2017   Procedure: EVACUATION HEMATOMA;  Surgeon: Serene Gaile ORN, MD;  Location: [facility] OR;  Service: Vascular;  Laterality: Right;   INSERTION OF RETROGRADE CAROTID STENT Right 12/18/2017   Procedure: INSERTION OF RETROGRADE CAROTID STENT;  Surgeon: Harvey Carlin BRAVO, MD;  Location: [facility] OR;  Service: Vascular;  Laterality: Right;   PATCH ANGIOPLASTY Left 06/25/2017   Procedure: PATCH ANGIOPLASTY OF LEFT CAROTID ARTERY USING HEMASHIELD PALTINUM FINESSE PATCH;  Surgeon: Eliza Lonni RAMAN, MD;  Location: [facility] OR;  Service: Vascular;  Laterality: Left;   RECTOCELE REPAIR     x2   TRANSCAROTID ARTERY REVASCULARIZATION  Right 09/19/2020   Procedure: RIGHT TRANSCAROTID ARTERY REVASCULARIZATION;  Surgeon: Harvey Carlin BRAVO, MD;  Location: [facility] OR;  Service: Vascular;  Laterality: Right;    Short Social History:  Social History   Tobacco Use   Smoking status: Every Day    Current packs/day: 1.50    Types: Cigarettes   Smokeless tobacco: Never  Substance Use Topics   Alcohol use: No    Allergies  Allergen Reactions   Statins Other (See Comments)    Muscle weakness Arm Pain Twitching     Current  Outpatient Medications  Medication Sig Dispense Refill   REPATHA SURECLICK 140 MG/ML SOAJ INJECT 1 ML EVERY 2 WEEKS BY SUBCUTANEOUS ROUTE FOR 30 DAYS.     alendronate (FOSAMAX) 70 MG tablet Take 70 mg by mouth once a week.     aspirin  EC 81 MG tablet Take 81 mg by mouth daily. Swallow whole.     BD PEN NEEDLE NANO 2ND GEN 32G X 4 MM MISC  (Patient not taking: Reported on 09/13/2022)     calcium -vitamin D (OSCAL WITH D) 500-200 MG-UNIT tablet Take 1 tablet by mouth 3 (three) times a week.     clopidogrel  (PLAVIX ) 75 MG tablet TAKE 1 TABLET BY MOUTH EVERY DAY (Patient taking differently: Take 75 mg by mouth daily.) 90 tablet 2   glucose blood (PRECISION QID TEST) test strip 1 strip by Other route every day     No current facility-administered medications for this visit.    Review of Systems  Constitutional:  Constitutional negative. HENT: HENT negative.  Eyes: Eyes negative.  Respiratory: Positive for shortness of breath.  GI: Gastrointestinal negative.  Musculoskeletal: Musculoskeletal negative.  Skin: Skin negative.  Neurological: Neurological negative. Hematologic: Hematologic/lymphatic negative. Negative for bruises/bleeds easily. Psychiatric: Psychiatric negative.        Objective:  Objective   Vitals:   02/12/24 1543 02/12/24 1547  BP: 128/71 (!) 158/75  Pulse: 93   Temp: 97.9 F (36.6 C)   SpO2: 95%   Weight: 129 lb (58.5 kg)   Height: 5' 1 (1.549 m)    Body mass index is 24.37 kg/m.  Physical Exam HENT:     Head: Normocephalic.     Nose: Nose normal.  Neck:     Vascular: No carotid bruit.  Cardiovascular:     Rate and Rhythm: Normal rate.  Pulmonary:     Effort: Pulmonary effort is normal.  Abdominal:     General: Abdomen is flat.  Musculoskeletal:     Right lower leg: No edema.     Left lower leg: No edema.  Skin:    General: Skin is warm.     Capillary Refill: Capillary refill takes less than 2 seconds.  Neurological:     General: No focal deficit  present.     Mental Status: She is alert.     Data: Right Carotid Findings:  +----------+--------+--------+--------+------------------+-----------------  ----+           PSV cm/sEDV cm/sStenosisPlaque DescriptionComments                +----------+--------+--------+--------+------------------+-----------------  ----+  CCA Prox  211     37      <50%                      calcified walls  versus stent            +----------+--------+--------+--------+------------------+-----------------  ----+  CCA Distal48      15                                                        +----------+--------+--------+--------+------------------+-----------------  ----+  ECA      64      12                                                        +----------+--------+--------+--------+------------------+-----------------  ----+   +----------+--------+-------+----------------+-------------------+           PSV cm/sEDV cmsDescribe        Arm Pressure (mmHG)  +----------+--------+-------+----------------+-------------------+  [id number]    0      Multiphasic, [id number]                  +----------+--------+-------+----------------+-------------------+   +---------+--------+---+--------+--+---------+  VertebralPSV cm/s129EDV cm/s23Antegrade  +---------+--------+---+--------+--+---------+      Right Stent(s):  +---------------+--------+--------+---------------+----------+--------+  DST CCA-DST ICAPSV cm/sEDV cm/sStenosis       Waveform  Comments  +---------------+--------+--------+---------------+----------+--------+  Prox to Stent  45      12                     monophasic          +---------------+--------+--------+---------------+----------+--------+  Proximal Stent 51      14                     monophasic           +---------------+--------+--------+---------------+----------+--------+  Mid Stent      48      15                                         +---------------+--------+--------+---------------+----------+--------+  Distal Stent   328     83      50-75% stenosismonophasic          +---------------+--------+--------+---------------+----------+--------+  Distal to Stent198     59      <50% stenosis  monophasic          +---------------+--------+--------+---------------+----------+--------+          Left Carotid Findings:  +----------+--------+--------+--------+------------------+-----------------  -+           PSV cm/sEDV cm/sStenosisPlaque DescriptionComments             +----------+--------+--------+--------+------------------+-----------------  -+  CCA Prox  103     16                                                     +----------+--------+--------+--------+------------------+-----------------  -+  CCA Distal60      13                                intimal  thickening  +----------+--------+--------+--------+------------------+-----------------  -+  ICA Prox  29      8                                 intimal  thickening  +----------+--------+--------+--------+------------------+-----------------  -+  ICA Mid   121     41      40-59%                                         +----------+--------+--------+--------+------------------+-----------------  -+  ICA Distal124     41                                                     +----------+--------+--------+--------+------------------+-----------------  -+  ECA      95      3                                                      +----------+--------+--------+--------+------------------+-----------------  -+   +----------+--------+--------+----------------+-------------------+           PSV cm/sEDV cm/sDescribe        Arm Pressure (mmHG)   +----------+--------+--------+----------------+-------------------+  Subclavian281    26      Multiphasic, [id number]                  +----------+--------+--------+----------------+-------------------+   +---------+--------+--+--------+--+---------+  VertebralPSV cm/s78EDV cm/s22Antegrade  +---------+--------+--+--------+--+---------+         Summary:  Right Carotid: Non-hemodynamically significant plaque <50% noted in the  CCA.                50-75% distal ICA stent stenosis.   Left Carotid: Velocities in the left ICA are consistent with a 40-59%  stenosis.               S/P CEA.   Vertebrals:  Bilateral vertebral arteries demonstrate antegrade flow.  Subclavians: Normal flow hemodynamics were seen in bilateral subclavian               arteries.      Assessment/Plan:     58 year old female with history as above.  Given her extensive history we have again discussed her need for urgent smoking cessation although I feel she is unlikely to quit.  We reviewed her most recent CT scan from almost 2 years ago and compare that with today's duplex and she has similar stenosis of the distal right ICA stent.  Unfortunately I think she would be at high risk for additional neurologic symptoms with any intervention and I am unsure that any intervention would have durable results with her continued smoking.  Certainly any neurologic issues would require intervention or if she were to quit smoking we could consider at least balloon angioplasty of her right carotid stent.  Short of that we will have her follow-up in 1 year with repeat carotid duplex.     Penne Lonni Colorado MD [facility]

## 2024-02-19 ENCOUNTER — Ambulatory Visit: Admitting: Vascular Surgery
